# Patient Record
Sex: Female | Born: 1980 | Race: Black or African American | Hispanic: No | Marital: Single | Smoking: Current every day smoker
Health system: Southern US, Community
[De-identification: ages and names within clinical notes are randomized; demographics above are authoritative.]

## PROBLEM LIST (undated history)

## (undated) DIAGNOSIS — J45909 Unspecified asthma, uncomplicated: Secondary | ICD-10-CM

## (undated) DIAGNOSIS — Z720 Tobacco use: Secondary | ICD-10-CM

## (undated) DIAGNOSIS — Z8759 Personal history of other complications of pregnancy, childbirth and the puerperium: Secondary | ICD-10-CM

## (undated) DIAGNOSIS — I10 Essential (primary) hypertension: Secondary | ICD-10-CM

## (undated) DIAGNOSIS — F1290 Cannabis use, unspecified, uncomplicated: Secondary | ICD-10-CM

---

## 2021-11-30 ENCOUNTER — Observation Stay (HOSPITAL_COMMUNITY): Payer: Medicaid - Out of State

## 2021-11-30 ENCOUNTER — Encounter (HOSPITAL_COMMUNITY): Payer: Self-pay | Admitting: Internal Medicine

## 2021-11-30 ENCOUNTER — Emergency Department (HOSPITAL_COMMUNITY): Payer: Medicaid - Out of State

## 2021-11-30 ENCOUNTER — Observation Stay (HOSPITAL_COMMUNITY)
Admission: EM | Admit: 2021-11-30 | Discharge: 2021-12-01 | Disposition: A | Discharge status: Home / Self Care | Payer: Medicaid - Out of State | Attending: Internal Medicine | Admitting: Internal Medicine

## 2021-11-30 DIAGNOSIS — R569 Unspecified convulsions: Principal | ICD-10-CM

## 2021-11-30 DIAGNOSIS — Z20822 Contact with and (suspected) exposure to covid-19: Secondary | ICD-10-CM | POA: Diagnosis not present

## 2021-11-30 DIAGNOSIS — I10 Essential (primary) hypertension: Secondary | ICD-10-CM | POA: Diagnosis not present

## 2021-11-30 DIAGNOSIS — N3941 Urge incontinence: Secondary | ICD-10-CM | POA: Diagnosis not present

## 2021-11-30 DIAGNOSIS — G4733 Obstructive sleep apnea (adult) (pediatric): Secondary | ICD-10-CM

## 2021-11-30 DIAGNOSIS — R4182 Altered mental status, unspecified: Secondary | ICD-10-CM

## 2021-11-30 DIAGNOSIS — M25559 Pain in unspecified hip: Secondary | ICD-10-CM

## 2021-11-30 DIAGNOSIS — R631 Polydipsia: Secondary | ICD-10-CM | POA: Insufficient documentation

## 2021-11-30 HISTORY — DX: Personal history of other complications of pregnancy, childbirth and the puerperium: Z87.59

## 2021-11-30 HISTORY — DX: Unspecified asthma, uncomplicated: J45.909

## 2021-11-30 HISTORY — DX: Essential (primary) hypertension: I10

## 2021-11-30 HISTORY — DX: Cannabis use, unspecified, uncomplicated: F12.90

## 2021-11-30 HISTORY — DX: Tobacco use: Z72.0

## 2021-11-30 LAB — TROPONIN I (HIGH SENSITIVITY)
Troponin I (High Sensitivity): 12 ng/L (ref <18)
Troponin I (High Sensitivity): 14 ng/L (ref <18)

## 2021-11-30 LAB — COMPREHENSIVE METABOLIC PANEL
ALT: 16 U/L (ref 0–44)
AST: 24 U/L (ref 15–41)
Albumin: 3.6 g/dL (ref 3.5–5.0)
Alkaline Phosphatase: 99 U/L (ref 38–126)
Anion gap: 10 (ref 5–15)
BUN: 12 mg/dL (ref 6–20)
CO2: 20 mmol/L — ABNORMAL LOW (ref 22–32)
Calcium: 8.8 mg/dL — ABNORMAL LOW (ref 8.9–10.3)
Chloride: 106 mmol/L (ref 98–111)
Creatinine, Ser: 0.73 mg/dL (ref 0.44–1.00)
GFR, Estimated: >60 mL/min (ref >60)
Glucose, Bld: 117 mg/dL — ABNORMAL HIGH (ref 70–99)
Potassium: 4.3 mmol/L (ref 3.5–5.1)
Sodium: 136 mmol/L (ref 135–145)
Total Bilirubin: 0.3 mg/dL (ref 0.3–1.2)
Total Protein: 8.1 g/dL (ref 6.5–8.1)

## 2021-11-30 LAB — URINALYSIS, ROUTINE W REFLEX MICROSCOPIC
Bilirubin Urine: NEGATIVE
Glucose, UA: NEGATIVE mg/dL
Ketones, ur: NEGATIVE mg/dL
Leukocytes,Ua: NEGATIVE
Nitrite: NEGATIVE
Protein, ur: 30 mg/dL — AB
Specific Gravity, Urine: 1.01 (ref 1.005–1.030)
pH: 5 (ref 5.0–8.0)

## 2021-11-30 LAB — RESP PANEL BY RT-PCR (FLU A&B, COVID) ARPGX2
Influenza A by PCR: NEGATIVE
Influenza B by PCR: NEGATIVE
SARS Coronavirus 2 by RT PCR: NEGATIVE

## 2021-11-30 LAB — CBC
HCT: 44.2 % (ref 36.0–46.0)
Hemoglobin: 14.1 g/dL (ref 12.0–15.0)
MCH: 27.4 pg (ref 26.0–34.0)
MCHC: 31.9 g/dL (ref 30.0–36.0)
MCV: 86 fL (ref 80.0–100.0)
Platelets: 292 10*3/uL (ref 150–400)
RBC: 5.14 MIL/uL — ABNORMAL HIGH (ref 3.87–5.11)
RDW: 16.2 % — ABNORMAL HIGH (ref 11.5–15.5)
WBC: 10.1 10*3/uL (ref 4.0–10.5)
nRBC: 0 % (ref 0.0–0.2)

## 2021-11-30 LAB — RAPID URINE DRUG SCREEN, HOSP PERFORMED
Amphetamines: NOT DETECTED
Barbiturates: NOT DETECTED
Benzodiazepines: NOT DETECTED
Cocaine: NOT DETECTED
Opiates: NOT DETECTED
Tetrahydrocannabinol: POSITIVE — AB

## 2021-11-30 LAB — I-STAT BETA HCG BLOOD, ED (MC, WL, AP ONLY): I-stat hCG, quantitative: <5 m[IU]/mL (ref <5)

## 2021-11-30 LAB — PREGNANCY, URINE: Preg Test, Ur: NEGATIVE

## 2021-11-30 LAB — CBG MONITORING, ED: Glucose-Capillary: 119 mg/dL — ABNORMAL HIGH (ref 70–99)

## 2021-11-30 LAB — ETHANOL: Alcohol, Ethyl (B): <10 mg/dL (ref <10)

## 2021-11-30 IMAGING — CR DG CHEST 2V
2 series · 2 of 2 positions shown · non-contrast
Comparison: [DATE] at [DATE] p.m.

CLINICAL DATA: Found unresponsive on bathroom floor, confusion

EXAM:
CHEST - 2 VIEW

[chest lat]
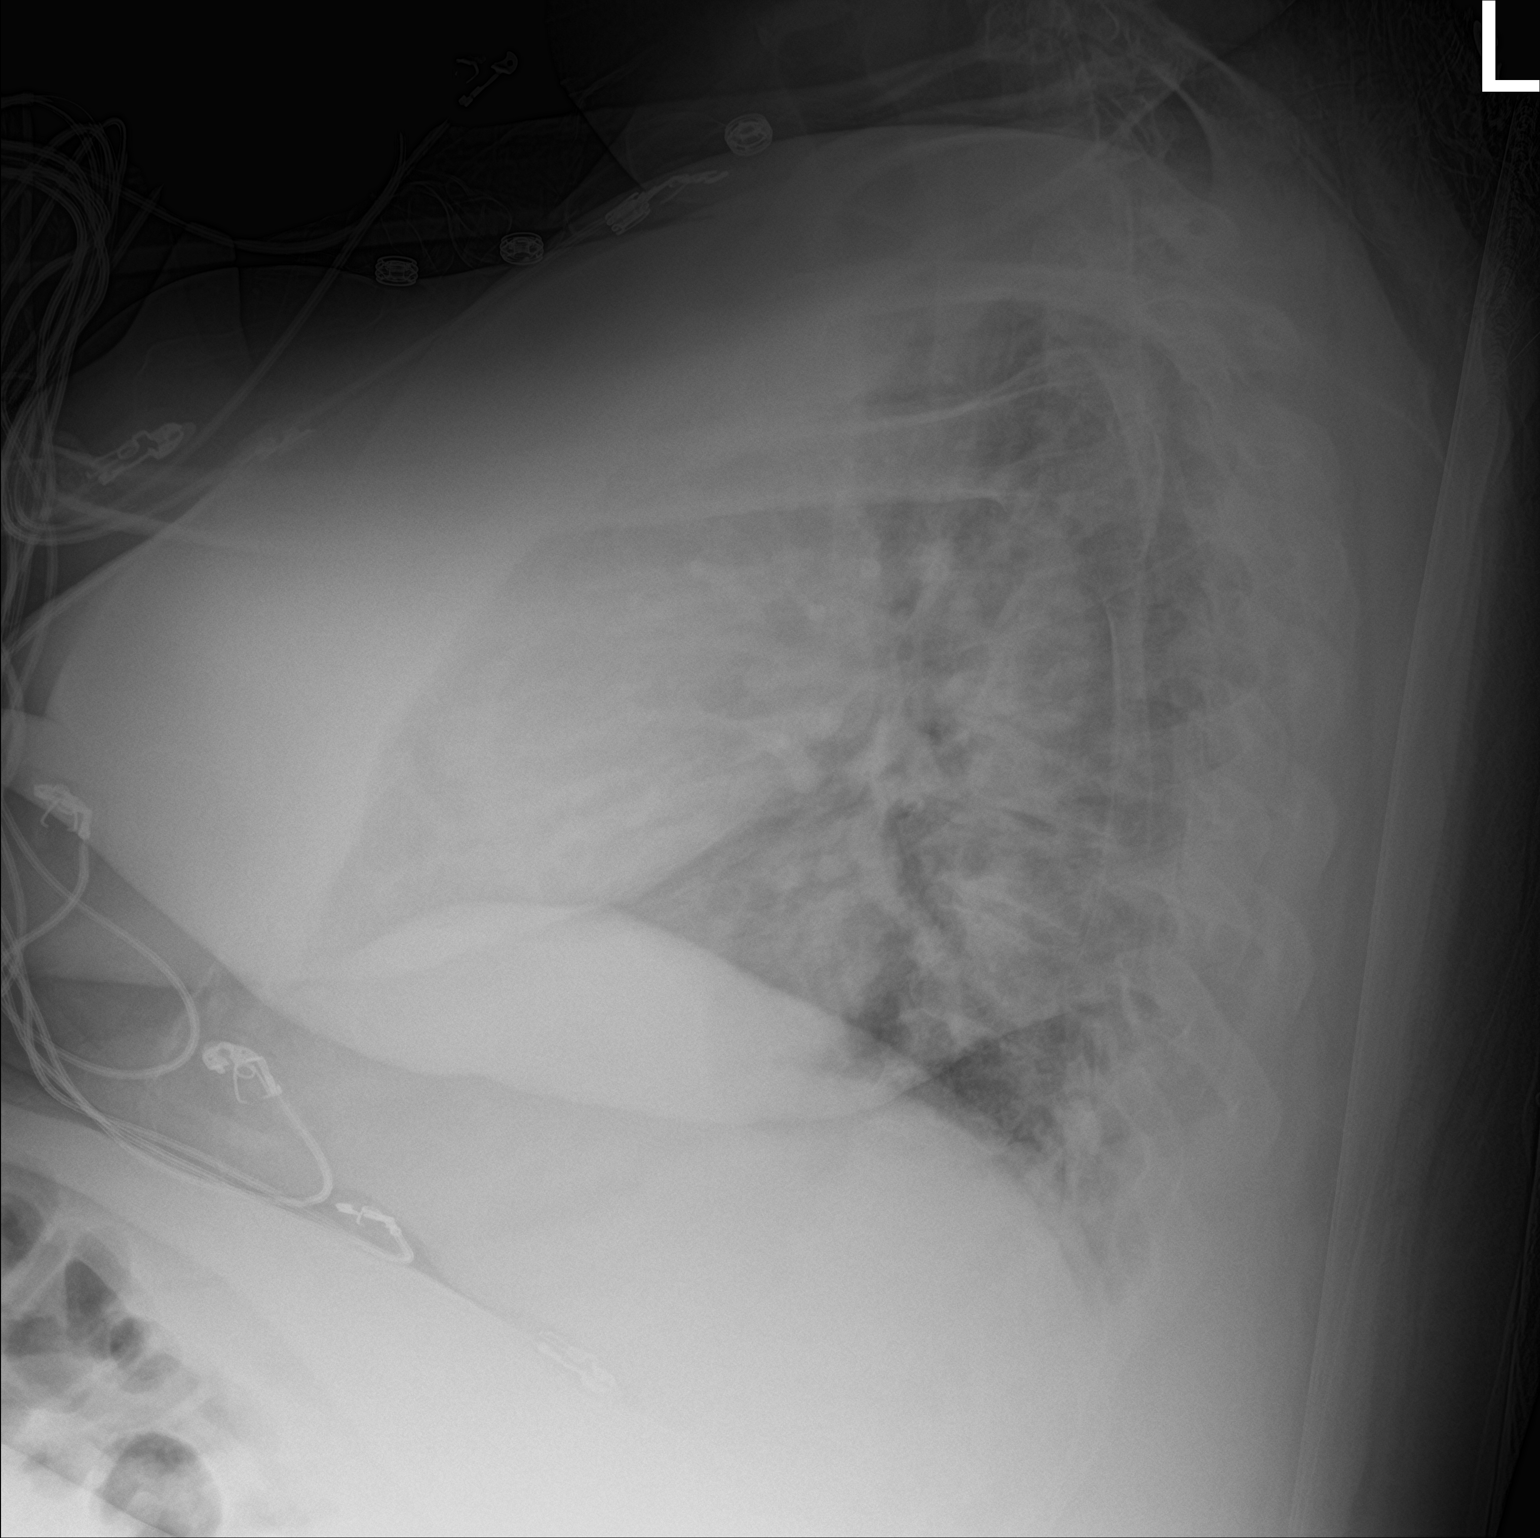

[chest ap]
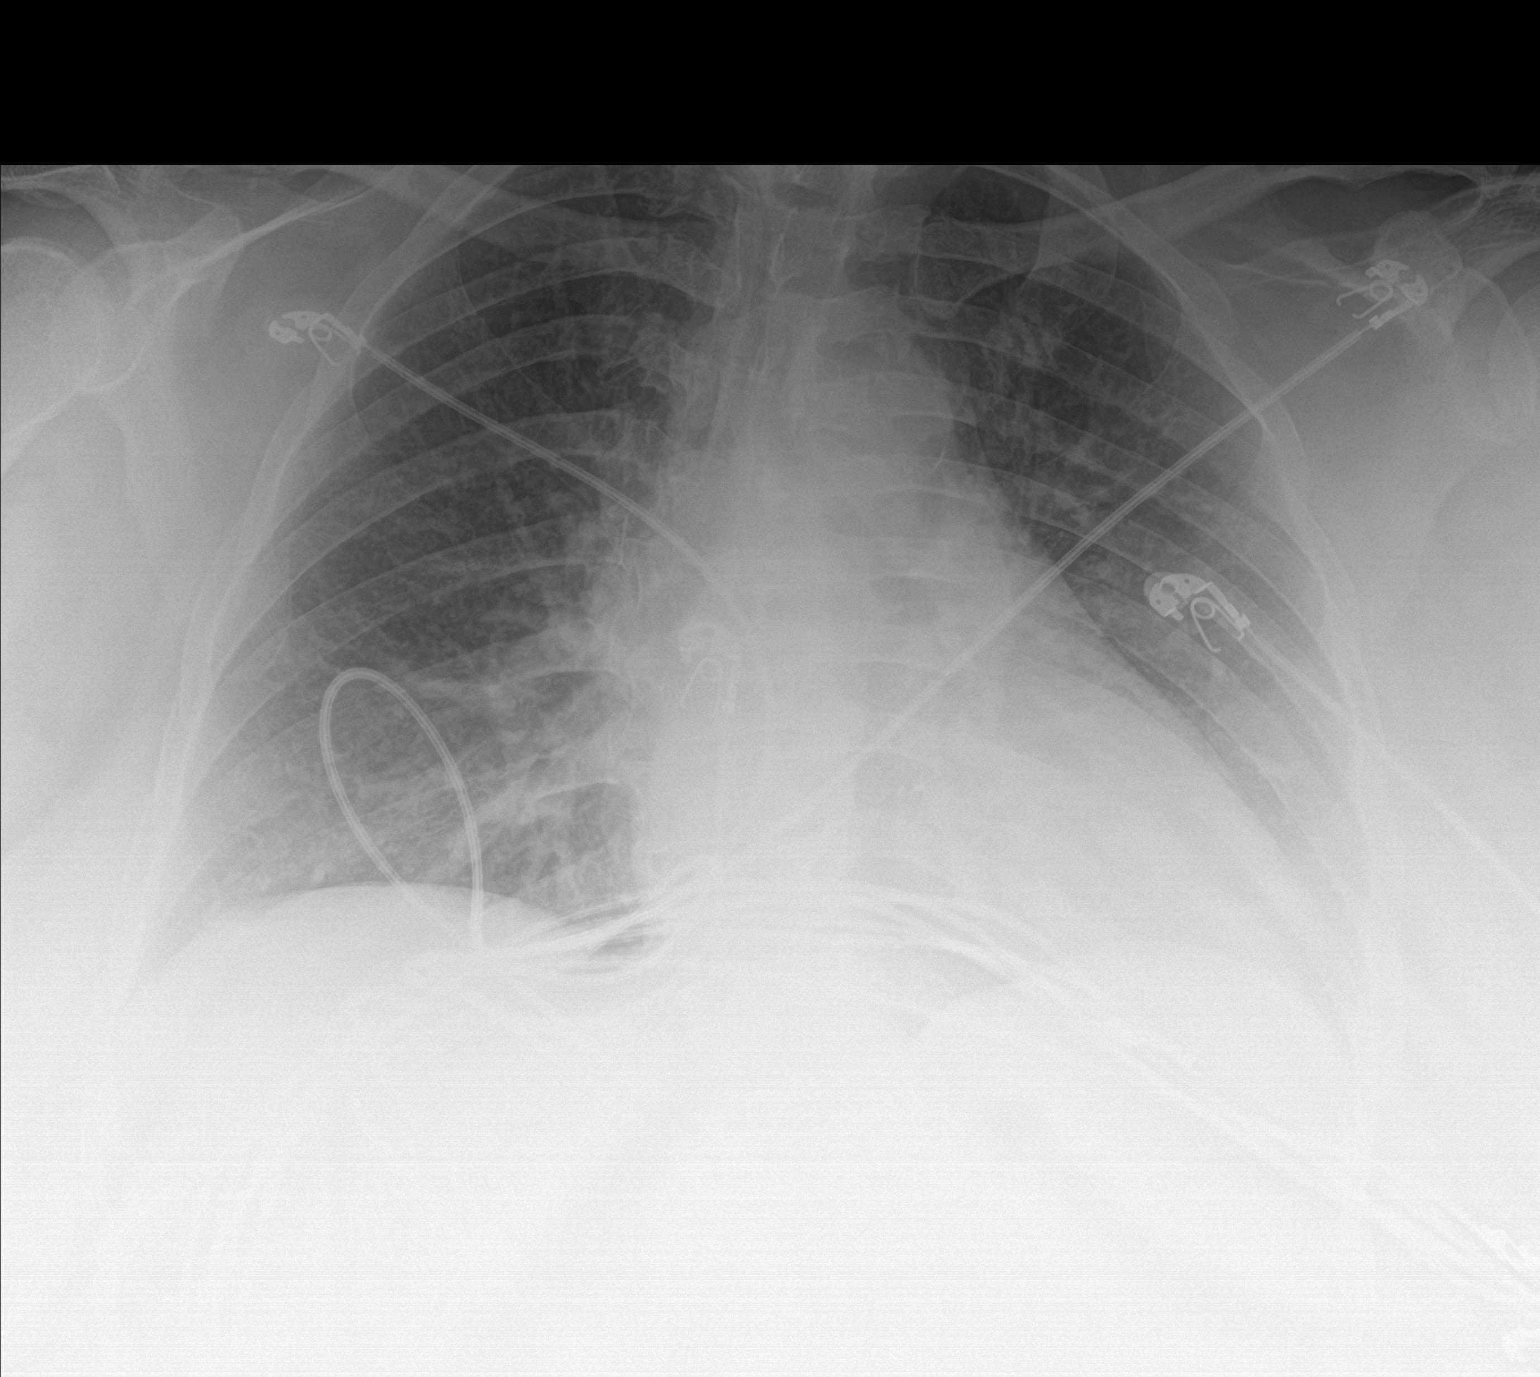

[2 of 2 positions shown; findings below may reference images not displayed]

FINDINGS: Frontal and lateral views of the chest demonstrate a stable enlarged
cardiac silhouette. No acute airspace disease, effusion, or
pneumothorax. No acute bony abnormality.
IMPRESSION: 1. Stable chest, no acute process.

## 2021-11-30 IMAGING — CR DG HIP (WITH OR WITHOUT PELVIS) 2-3V*L*
3 series · 3 of 3 positions shown · non-contrast
Comparison: None.

CLINICAL DATA: Acute left hip pain

EXAM:
DG HIP (WITH OR WITHOUT PELVIS) 2-3V LEFT

[pelvis ap]
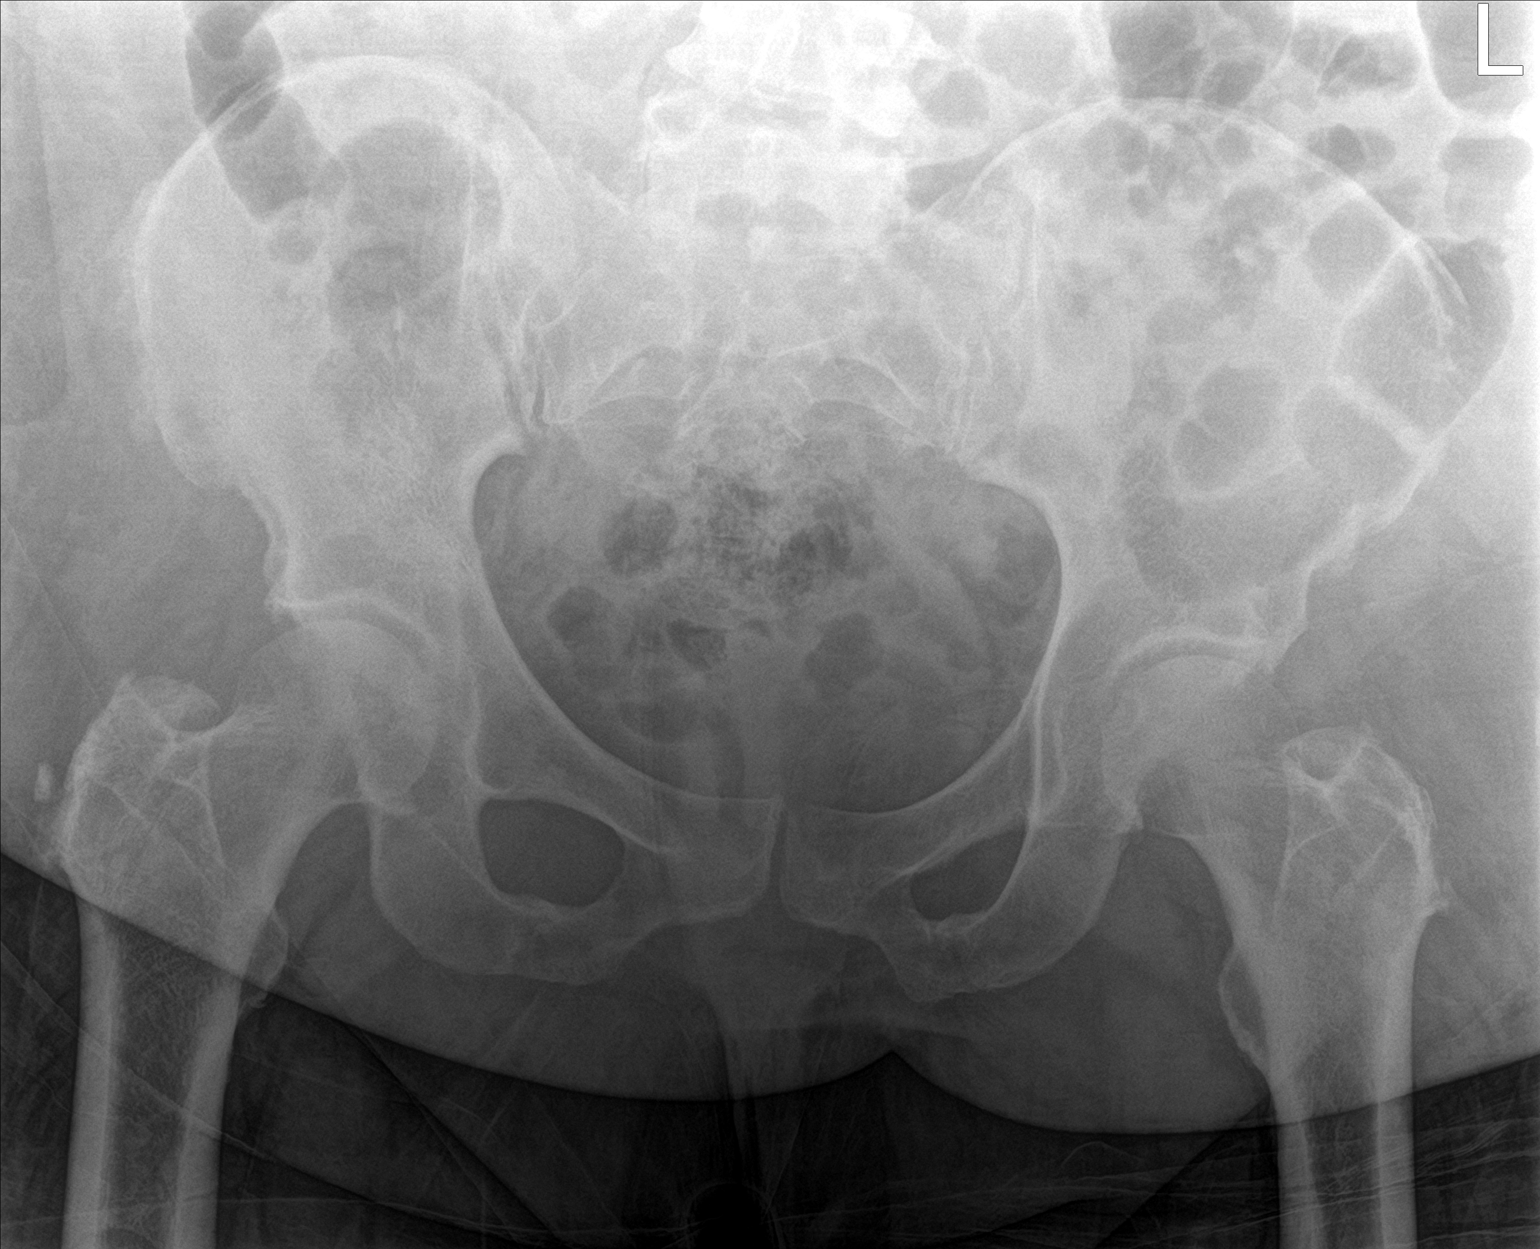

[hip ap]
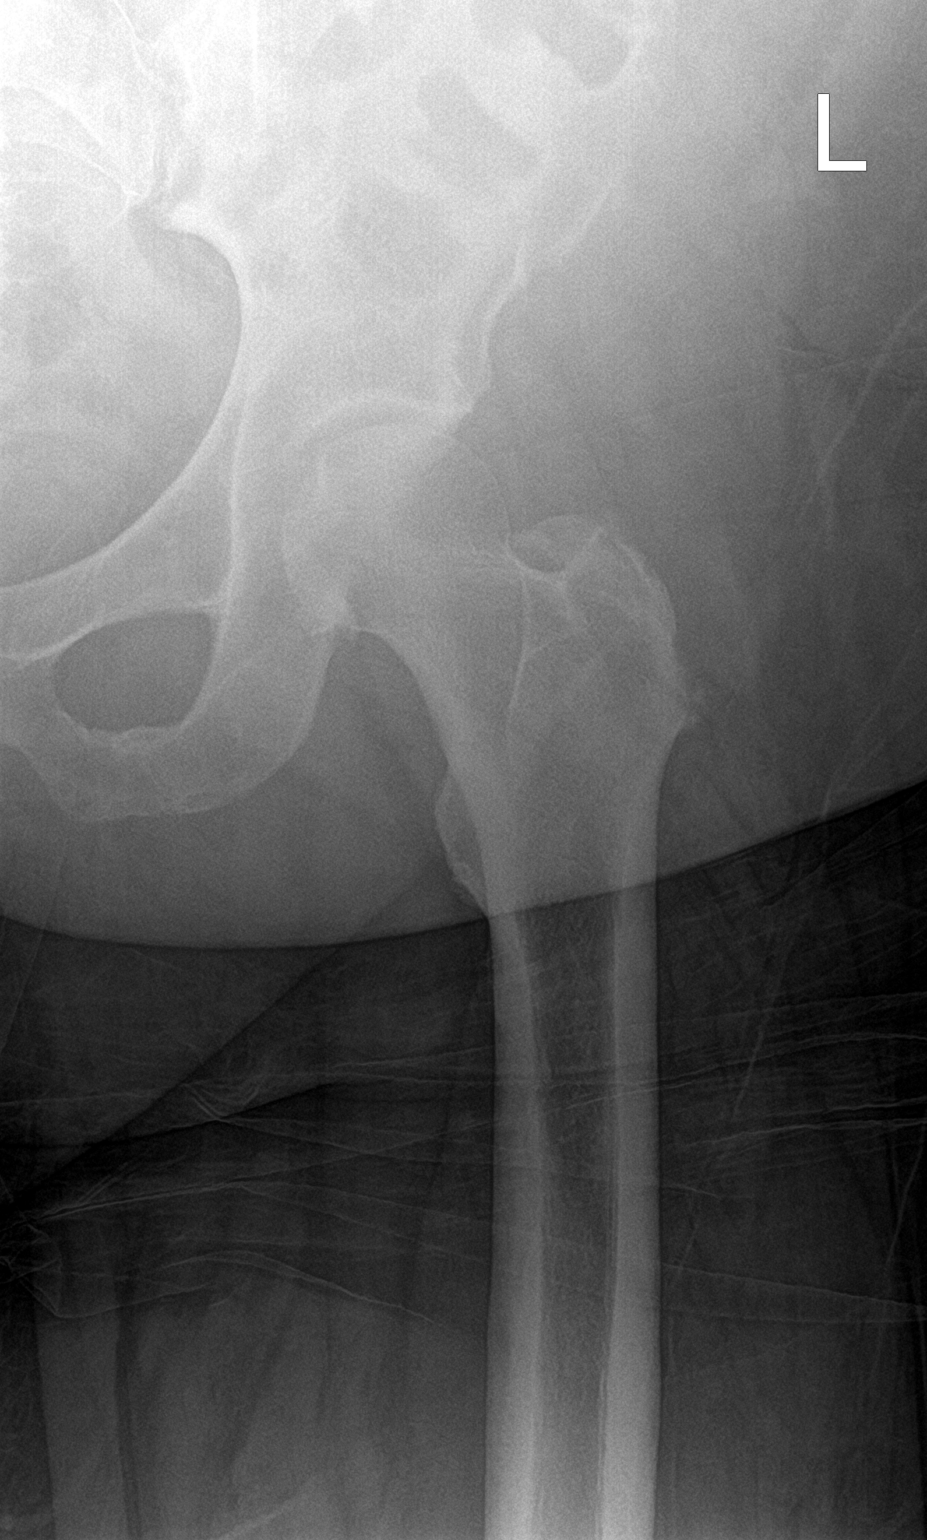

[hip lat]
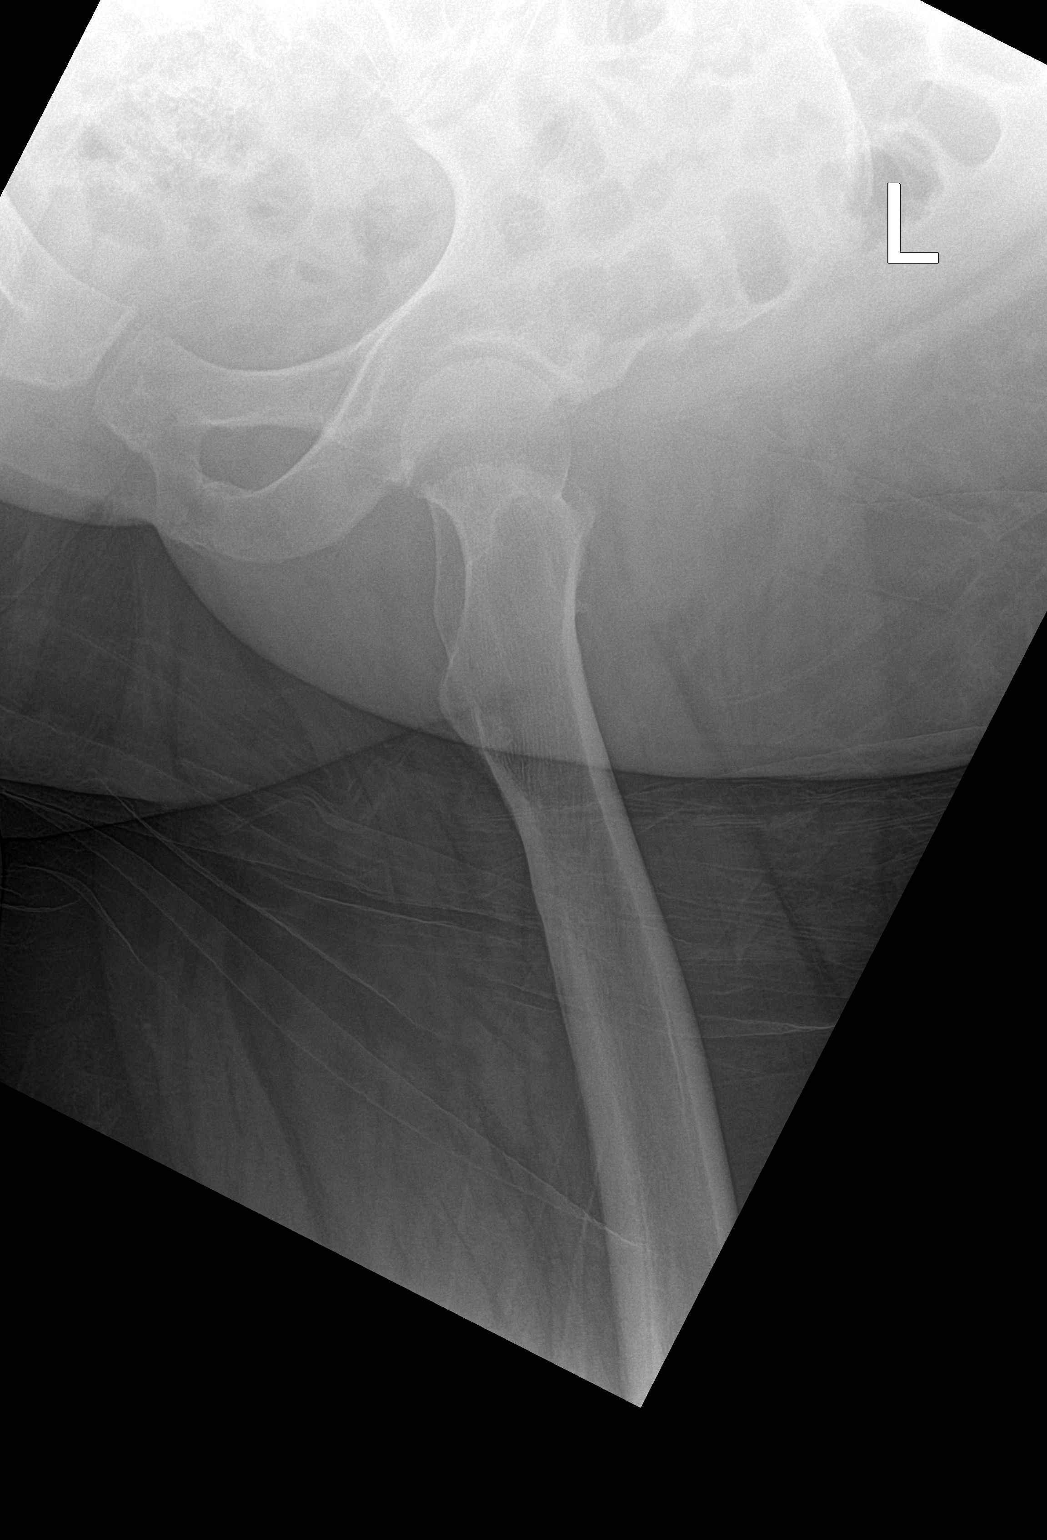

[3 of 3 positions shown; findings below may reference images not displayed]

FINDINGS: Frontal view of the pelvis as well as frontal and frogleg lateral
views of the left hip are obtained. No acute fracture, subluxation,
or dislocation. Joint spaces are well preserved. Soft tissues are
unremarkable.
IMPRESSION: 1. No acute bony abnormality.

## 2021-11-30 IMAGING — DX DG CHEST 1V PORT
1 series · 1 of 1 positions shown · non-contrast
Comparison: None.

CLINICAL DATA: Headache, found unresponsive on bathroom floor

EXAM:
PORTABLE CHEST 1 VIEW

[chest ap]
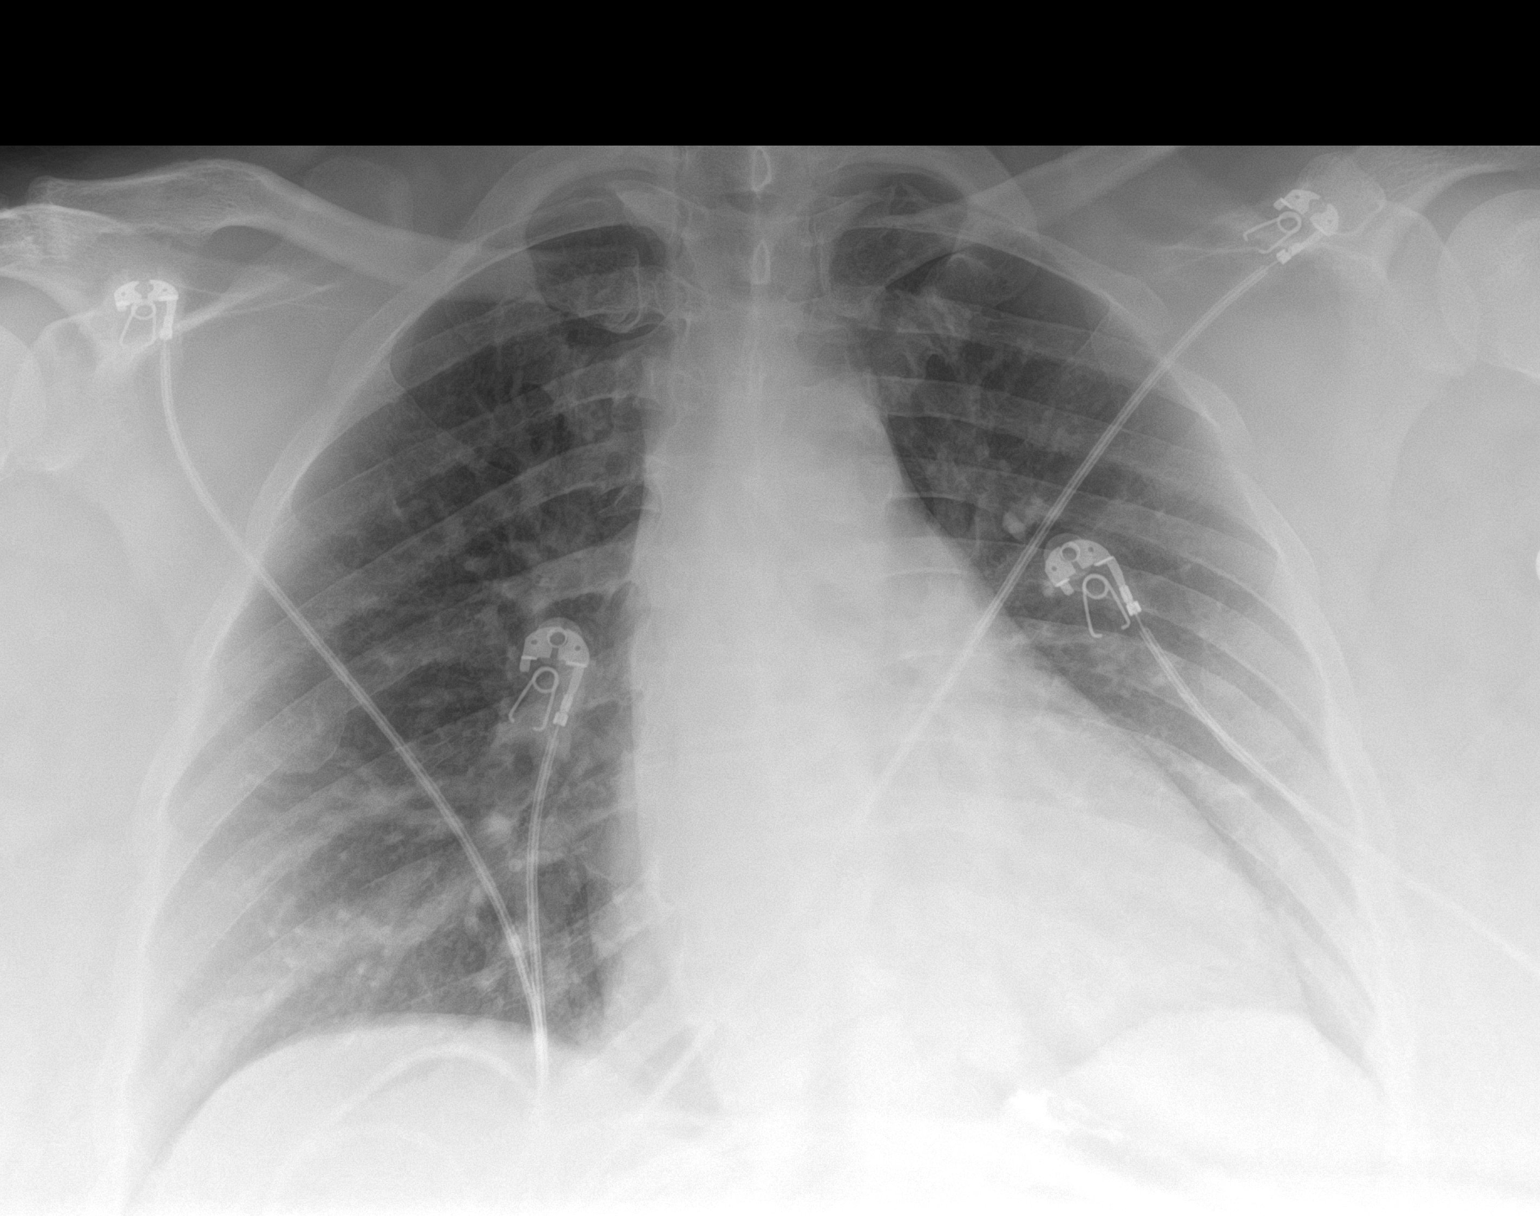

[1 of 1 positions shown; findings below may reference images not displayed]

FINDINGS: Top-normal heart size. Normal mediastinal contour. No pneumothorax.
No pleural effusion. Lungs appear clear, with no acute consolidative
airspace disease and no pulmonary edema.
IMPRESSION: No active disease.

## 2021-11-30 IMAGING — MR MR MRV HEAD W/O CM
1 series · 48 of 48 positions shown · non-contrast
Comparison: No pertinent prior exam.

CLINICAL DATA: Seizure

EXAM:
MRI HEAD WITHOUT CONTRAST
MRV HEAD WITHOUT CONTRAST
TECHNIQUE: Multiplanar, multi-echo pulse sequences of the brain and surrounding
structures were acquired without intravenous contrast. Angiographic
images of the intracranial venous structures were acquired using MRV
technique without intravenous contrast.

[Series 7: tof_fl2d_paracor · coronal · 2.0mm · 0.98mm/px · 48 of 150 slices shown]
[im 1/150]
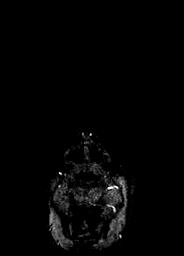
[im 4/150]
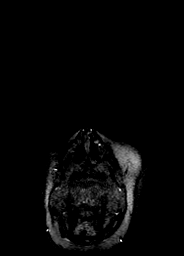
[im 7/150]
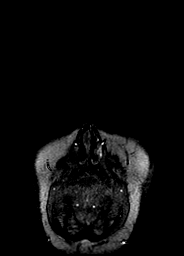
[im 10/150]
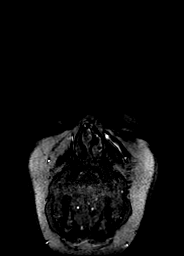
[im 13/150]
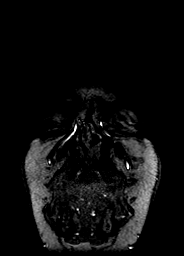
[im 16/150]
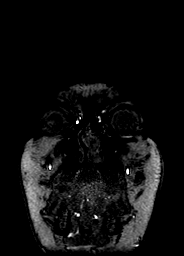
[im 20/150]
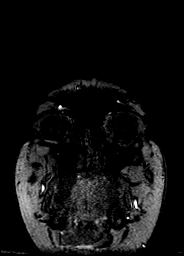
[im 23/150]
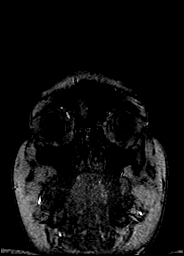
[im 26/150]
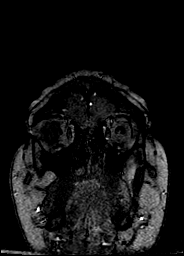
[im 29/150]
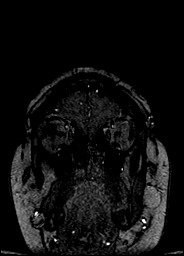
[im 32/150]
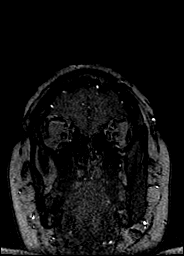
[im 35/150]
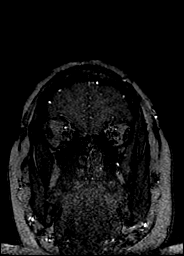
[im 39/150]
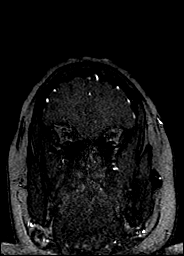
[im 42/150]
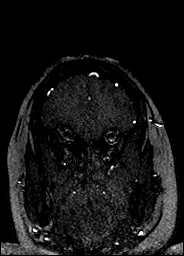
[im 45/150]
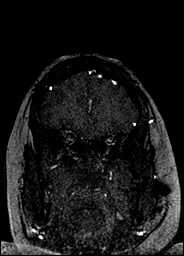
[im 48/150]
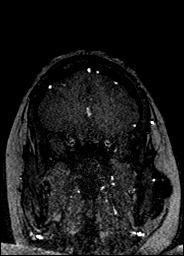
[im 51/150]
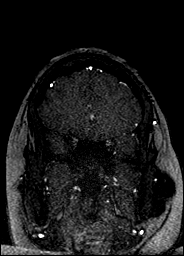
[im 54/150]
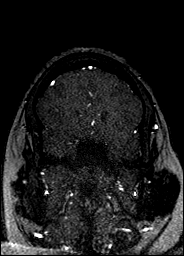
[im 58/150]
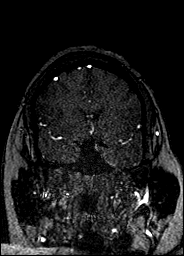
[im 61/150]
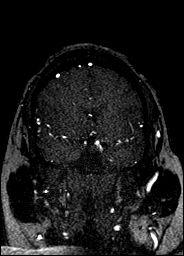
[im 64/150]
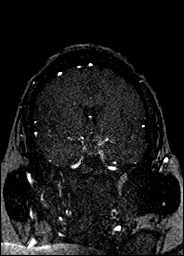
[im 67/150]
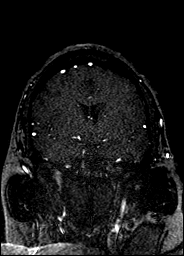
[im 70/150]
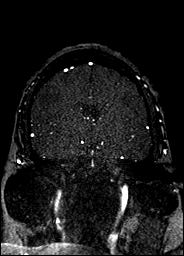
[im 73/150]
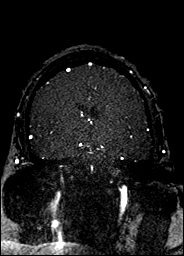
[im 77/150]
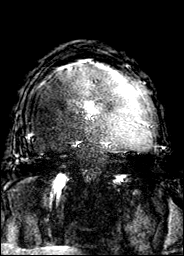
[im 80/150]
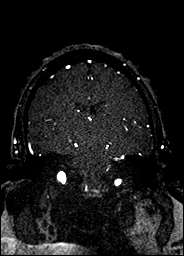
[im 83/150]
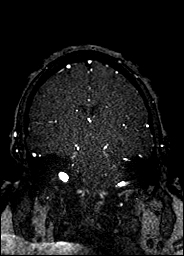
[im 86/150]
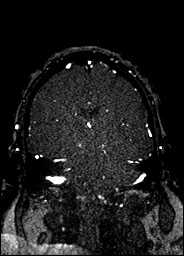
[im 89/150]
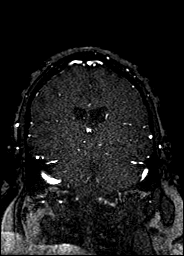
[im 92/150]
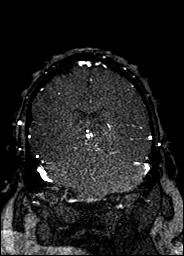
[im 96/150]
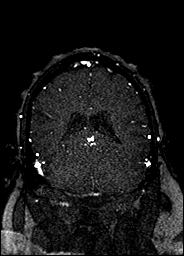
[im 99/150]
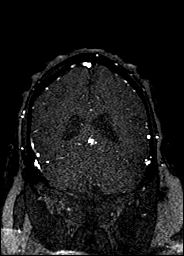
[im 102/150]
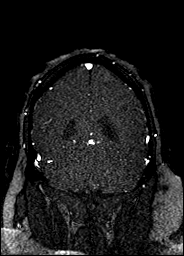
[im 105/150]
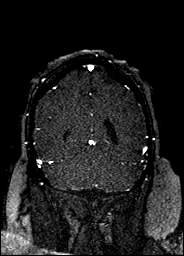
[im 108/150]
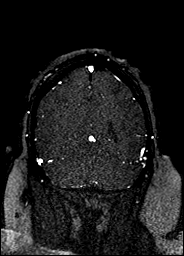
[im 111/150]
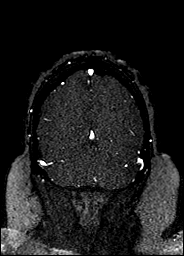
[im 115/150]
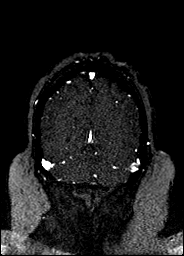
[im 118/150]
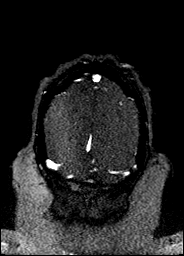
[im 121/150]
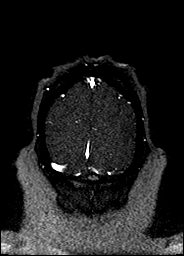
[im 124/150]
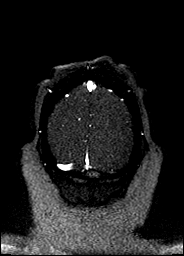
[im 127/150]
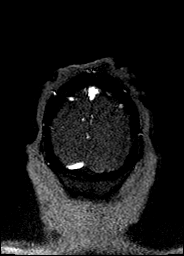
[im 130/150]
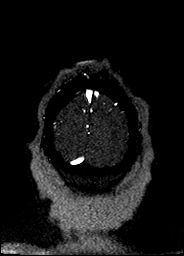
[im 134/150]
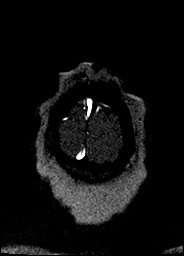
[im 137/150]
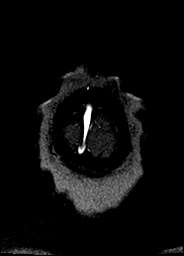
[im 140/150]
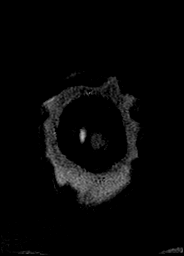
[im 143/150]
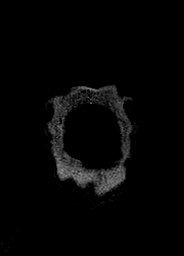
[im 146/150]
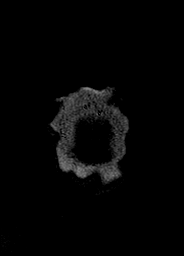
[im 150/150]
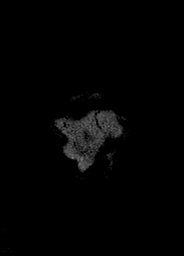

[48 of 48 positions shown; findings below may reference images not displayed]

FINDINGS: MRI HEAD WITHOUT CONTRAST

Brain: No acute infarct, mass effect or extra-axial collection. No
acute or chronic hemorrhage. Normal white matter signal, parenchymal
volume and CSF spaces. The midline structures are normal.

Vascular: Major flow voids are preserved.

Skull and upper cervical spine: Normal calvarium and skull base.
Visualized upper cervical spine and soft tissues are normal.

Sinuses/Orbits:No paranasal sinus fluid levels or advanced mucosal
thickening. No mastoid or middle ear effusion. Normal orbits.

MR VENOGRAM WITHOUT CONTRAST

Superior sagittal sinus: Normal.

Straight sinus: Normal.

Inferior sagittal sinus, vein of SALIOUS and internal cerebral veins:
Normal.

Transverse sinuses: Limited flow related enhancement of the left
transverse sinus, likely a normal variant. Right transverse sinus is
normal.

Sigmoid sinuses: Normal.

Visualized jugular veins: Normal.
IMPRESSION: Normal MRI and MRV of the brain.

## 2021-11-30 IMAGING — MR MR HEAD W/O CM
17 of 19 series · 41 of 48 positions shown · non-contrast
Comparison: No pertinent prior exam.

CLINICAL DATA: Seizure

EXAM:
MRI HEAD WITHOUT CONTRAST
MRV HEAD WITHOUT CONTRAST
TECHNIQUE: Multiplanar, multi-echo pulse sequences of the brain and surrounding
structures were acquired without intravenous contrast. Angiographic
images of the intracranial venous structures were acquired using MRV
technique without intravenous contrast.

[Series 5: DWI · axial · 3.0mm · 0.92mm/px · z∈[-100,+68]mm · 6 of 114 slices shown (1 of 4)]
[im 1/114]
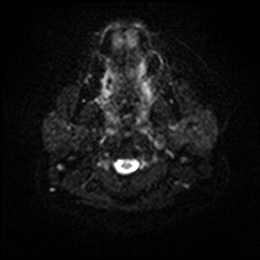
[im 23/114]
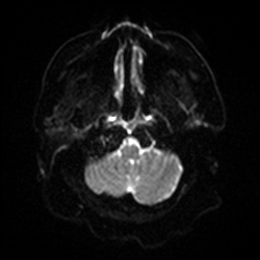
[im 46/114]
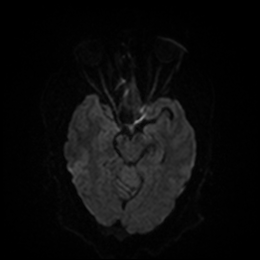
[im 68/114]
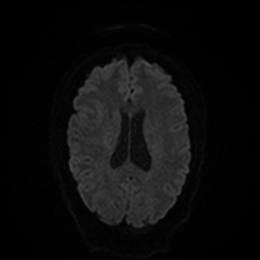
[im 91/114]
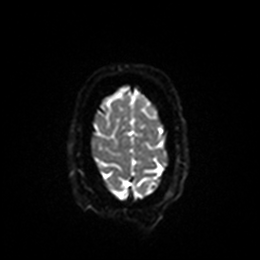
[im 114/114]
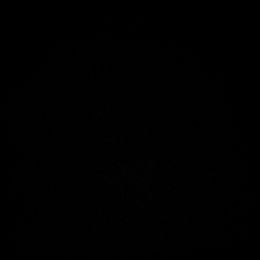

[Series 6: DWI · axial · 3.0mm · 0.92mm/px · z∈[-100,+62]mm · 3 of 55 slices shown (2 of 4)]
[im 1/55]
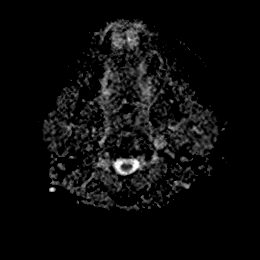
[im 28/55]
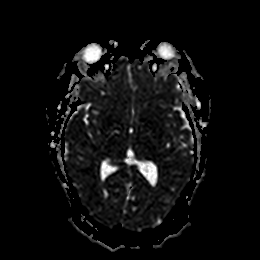
[im 55/55]
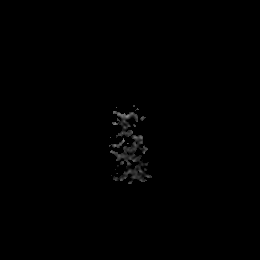

[Series 7: DWI · coronal · 4.0mm · 0.88mm/px · 3 of 82 slices shown (3 of 4)]
[im 1/82]
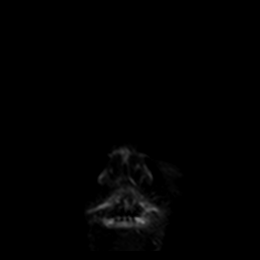
[im 41/82]
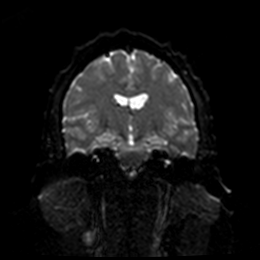
[im 82/82]
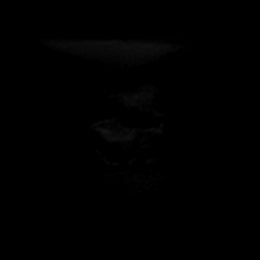

[Series 8: DWI · coronal · 4.0mm · 0.88mm/px · 2 of 41 slices shown (4 of 4)]
[im 1/41]
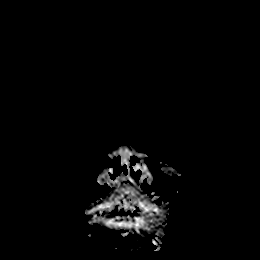
[im 41/41]
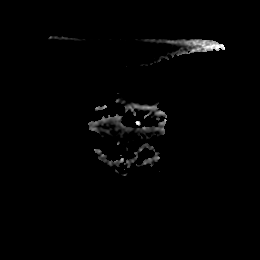

[Series 9: T1 · sagittal · 5.0mm · 0.78mm/px · 1 of 29 slices shown]
[im 1/29]
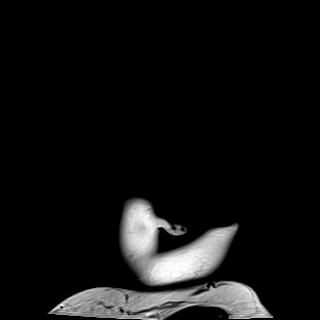

[Series 10: T2 · axial · 5.0mm · 0.75mm/px · 1 of 30 slices shown (1 of 4)]
[im 1/30]
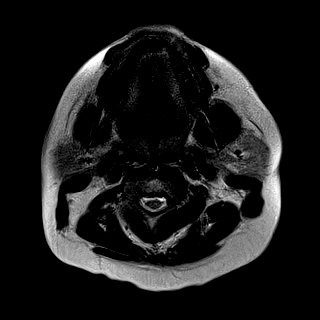

[Series 12: FLAIR · axial · 5.0mm · 0.94mm/px · 1 of 29 slices shown (1 of 2)]
[im 1/29]
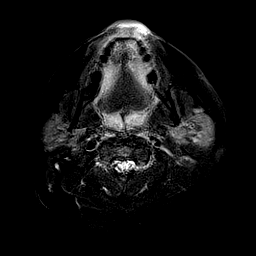

[Series 13: mag_images · axial · 3.0mm · 0.94mm/px · z∈[-105,+72]mm · 2 of 60 slices shown]
[im 1/60]
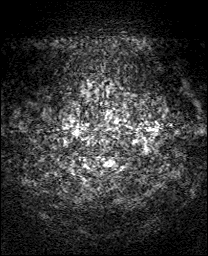
[im 60/60]
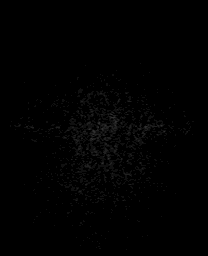

[Series 14: pha_images · axial · 3.0mm · 0.94mm/px · z∈[-102,+72]mm · 2 of 50 slices shown]
[im 1/50]
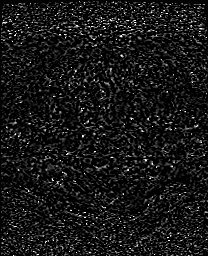
[im 50/50]
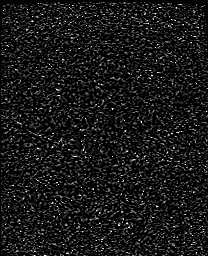

[Series 15: swi_images · axial · 3.0mm · 0.94mm/px · z∈[-105,+72]mm · 2 of 60 slices shown]
[im 1/60]
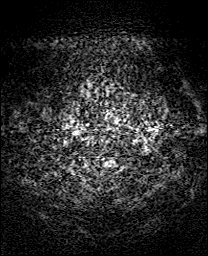
[im 60/60]
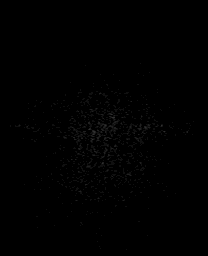

[Series 16: mip_images(sw) · axial · 24.0mm · 0.94mm/px · z∈[-94,+62]mm · 2 of 53 slices shown]
[im 1/53]
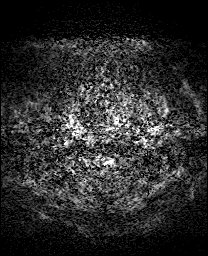
[im 53/53]
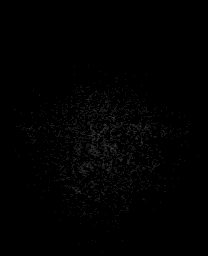

[Series 18: t1_mprage_tra_p2_iso · axial · 1.0mm · 0.98mm/px · z∈[-104,+71]mm · 7 of 176 slices shown]
[im 1/176]
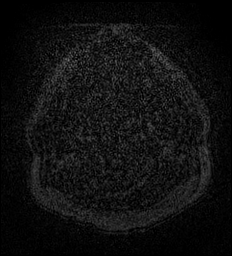
[im 30/176]
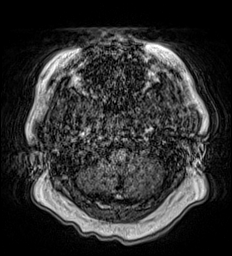
[im 59/176]
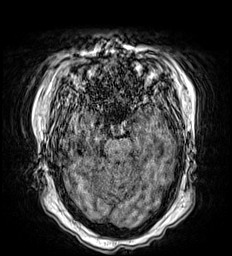
[im 88/176]
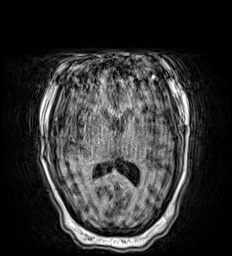
[im 117/176]
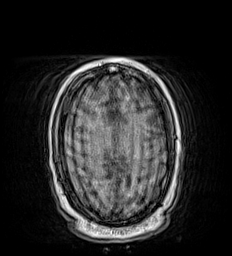
[im 146/176]
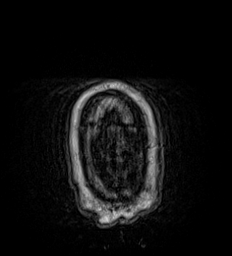
[im 176/176]
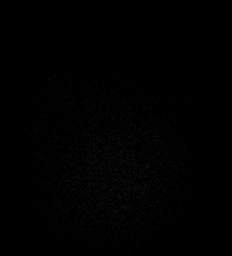

[Series 19: t1_mprage_tra_p2_iso_mpr_coronal · coronal · 1.0mm · 0.45mm/px · 2 of 124 slices shown]
[im 1/124]
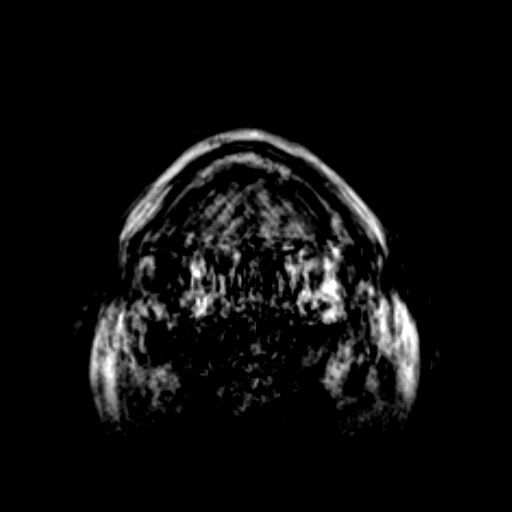
[im 31/124]
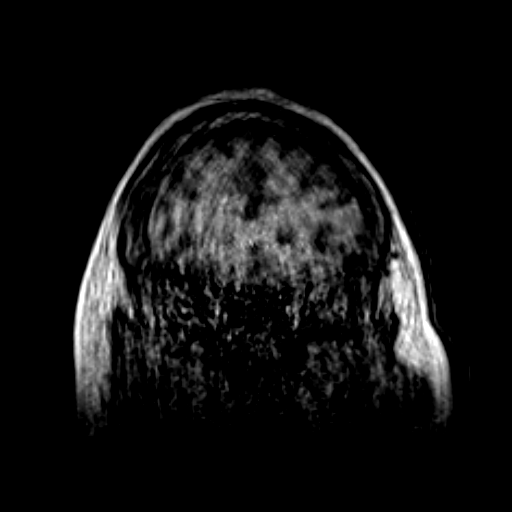

[Series 21: T2 · coronal · 3.0mm · 0.30mm/px · 2 of 43 slices shown (2 of 4)]
[im 1/43]
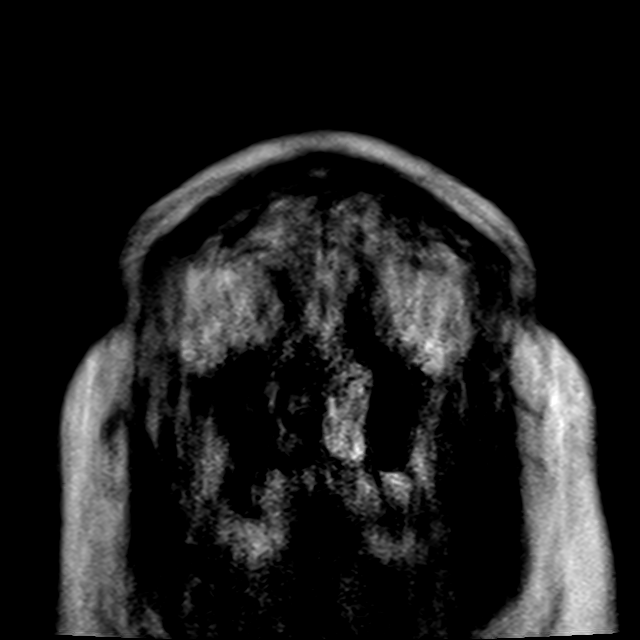
[im 43/43]
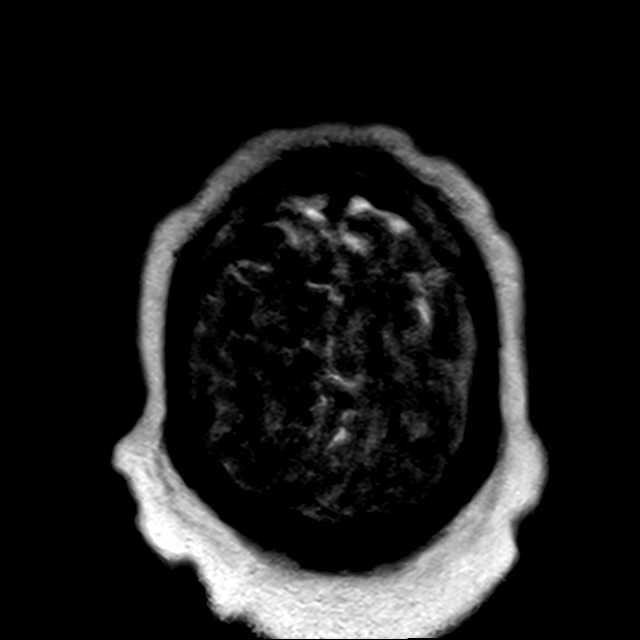

[Series 22: FLAIR · coronal · 3.0mm · 0.59mm/px · 2 of 41 slices shown (2 of 2)]
[im 1/41]
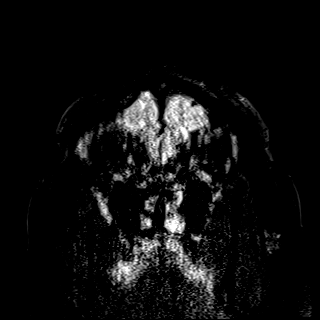
[im 41/41]
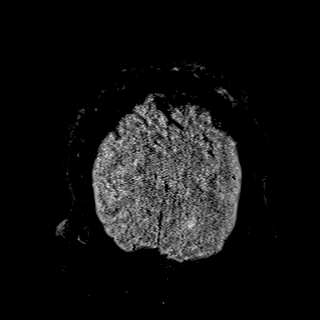

[Series 23: T2 · coronal · 3.0mm · 0.30mm/px · 2 of 43 slices shown (3 of 4)]
[im 1/43]
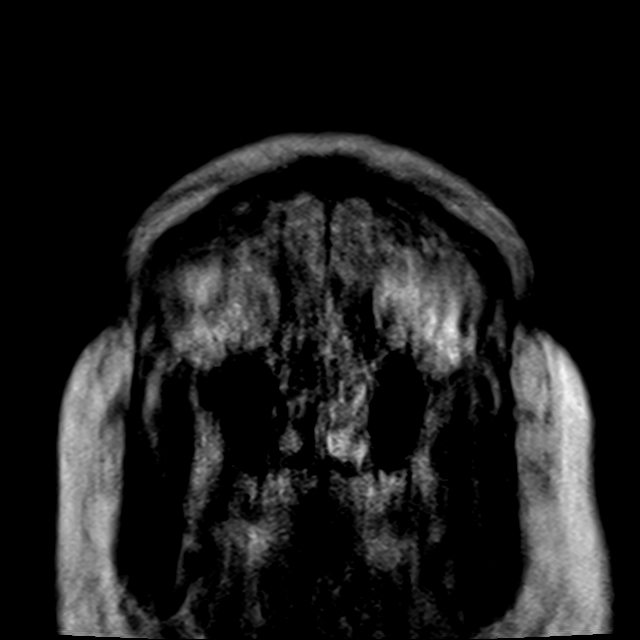
[im 43/43]
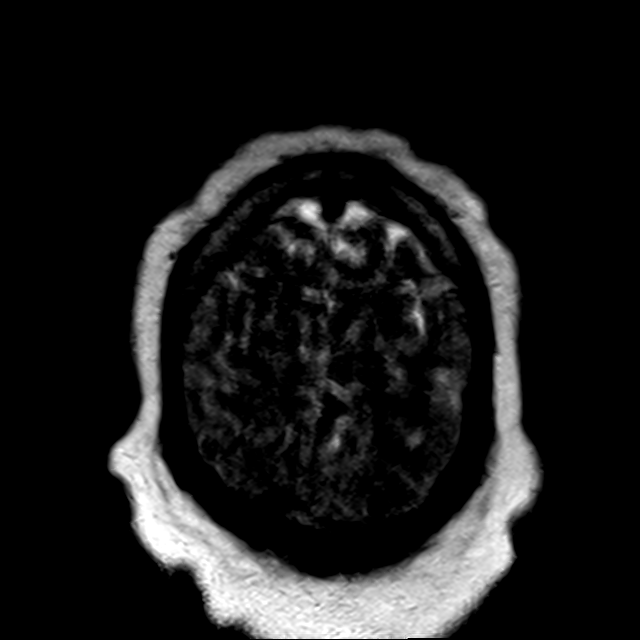

[Series 24: T2 · coronal · 5.0mm · 0.72mm/px · 1 of 35 slices shown (4 of 4)]
[im 1/35]
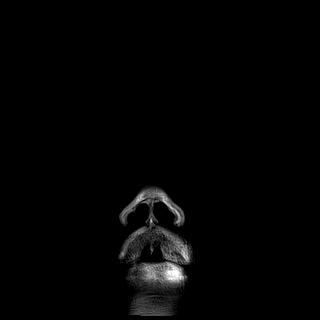

[41 of 48 positions shown; findings below may reference images not displayed]

FINDINGS: MRI HEAD WITHOUT CONTRAST

Brain: No acute infarct, mass effect or extra-axial collection. No
acute or chronic hemorrhage. Normal white matter signal, parenchymal
volume and CSF spaces. The midline structures are normal.

Vascular: Major flow voids are preserved.

Skull and upper cervical spine: Normal calvarium and skull base.
Visualized upper cervical spine and soft tissues are normal.

Sinuses/Orbits:No paranasal sinus fluid levels or advanced mucosal
thickening. No mastoid or middle ear effusion. Normal orbits.

MR VENOGRAM WITHOUT CONTRAST

Superior sagittal sinus: Normal.

Straight sinus: Normal.

Inferior sagittal sinus, vein of SALIOUS and internal cerebral veins:
Normal.

Transverse sinuses: Limited flow related enhancement of the left
transverse sinus, likely a normal variant. Right transverse sinus is
normal.

Sigmoid sinuses: Normal.

Visualized jugular veins: Normal.
IMPRESSION: Normal MRI and MRV of the brain.

## 2021-11-30 IMAGING — CT CT HEAD W/O CM
3 series · 17 of 37 positions shown, 19 images · non-contrast
Comparison: None.

CLINICAL DATA: Syncope.  Altered mental status.  Seizures.



[Series 3: head bone · axial · 0.45mm/px · z∈[-112,+10]mm · 7 of 88 slices shown]
[im 9/88  bone]
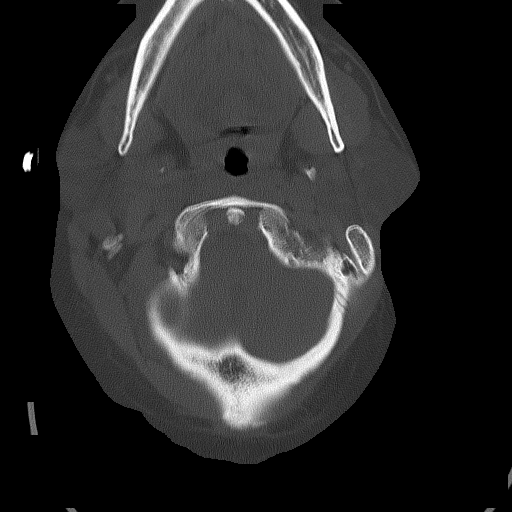
[im 18/88  bone]
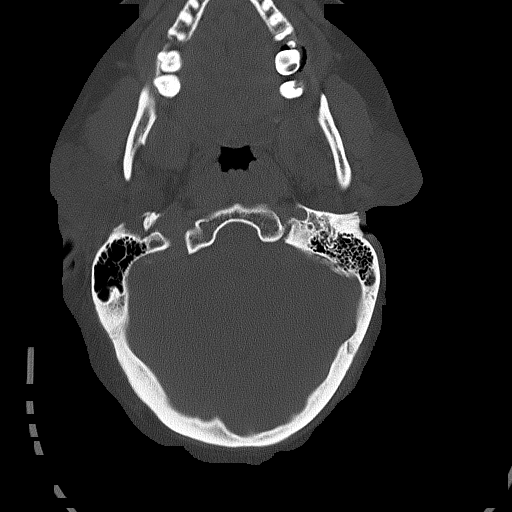
[im 27/88  bone]
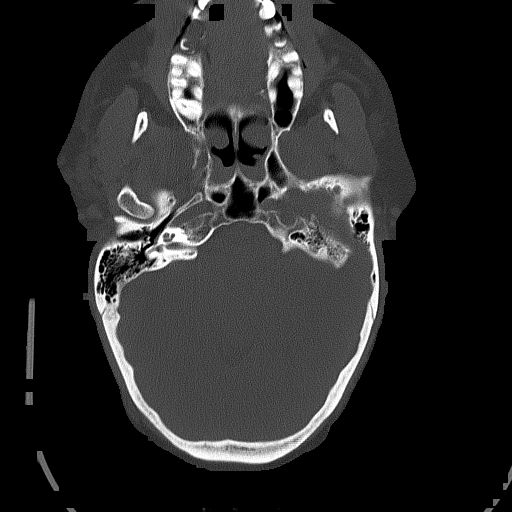
[im 40/88  bone]
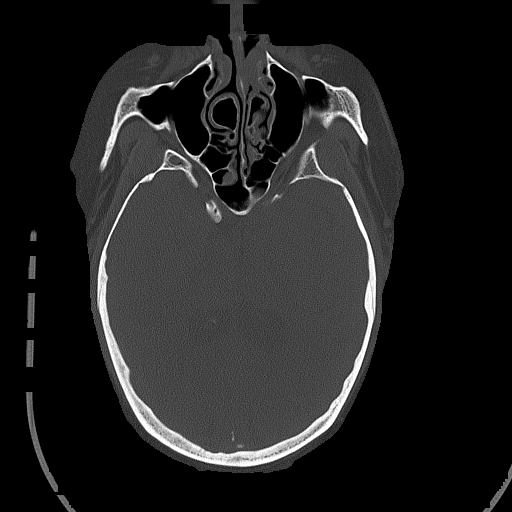
[im 48/88  bone]
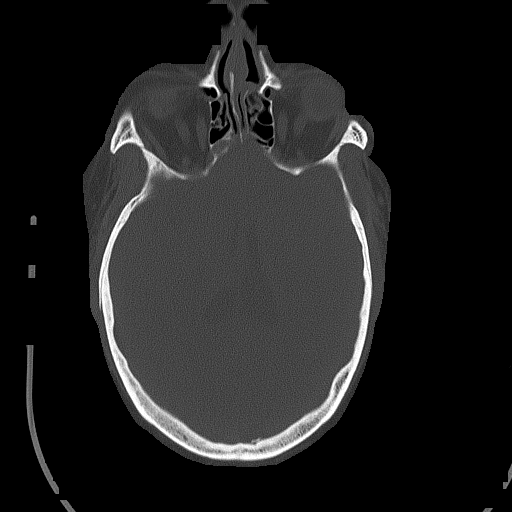
[im 61/88  bone]
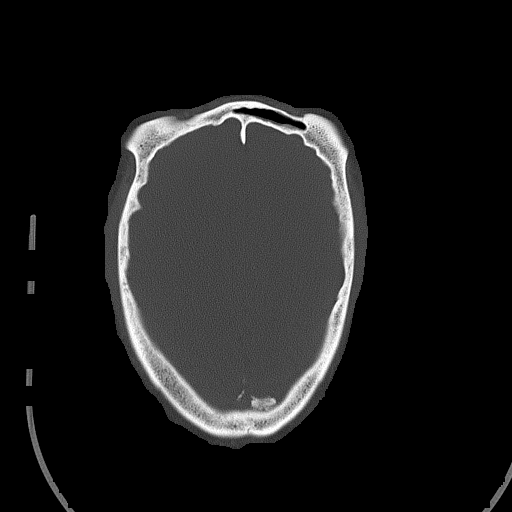
[im 70/88  bone]
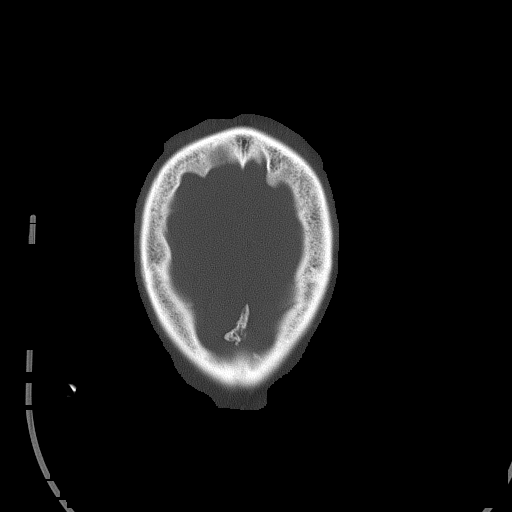

[Series 4: head without · axial · non-contrast · 0.45mm/px · z∈[-108,+18]mm · 7 of 35 slices shown, 9 images]
[im 5/35  brain]
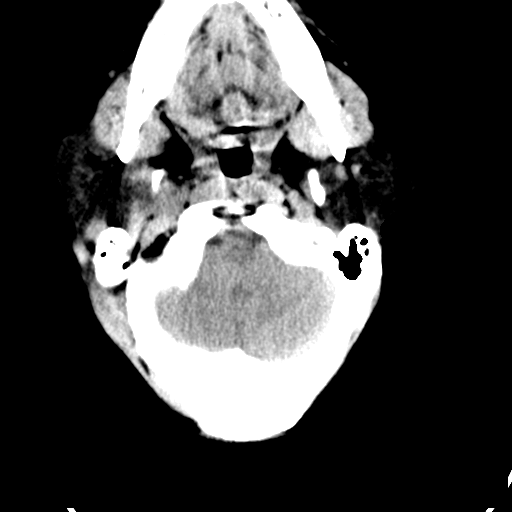
[im 5/35  bone]
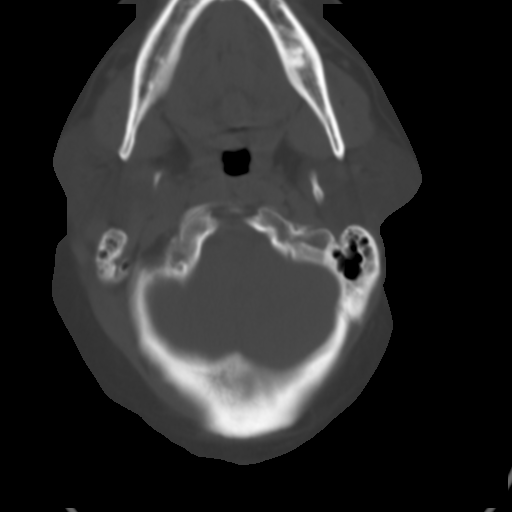
[im 9/35  brain]
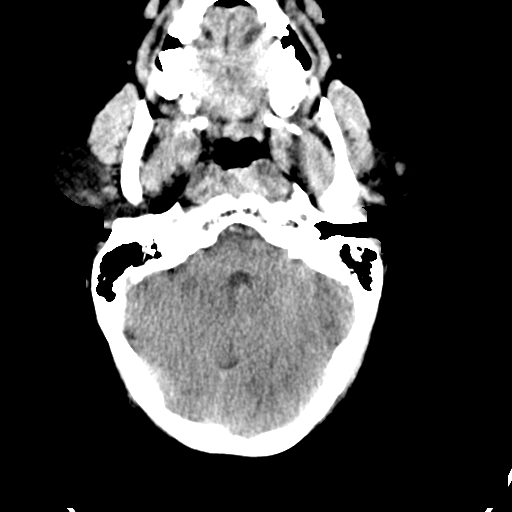
[im 13/35  brain]
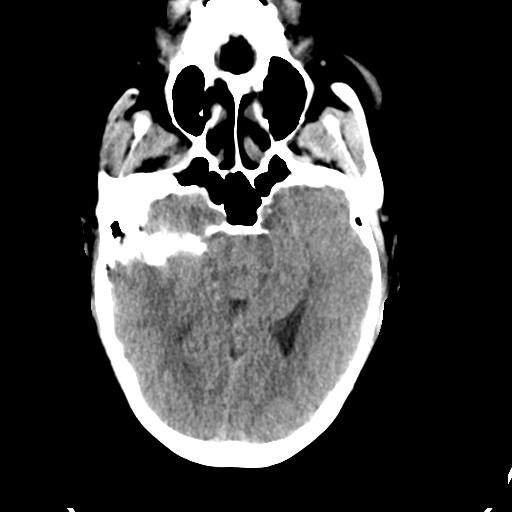
[im 18/35  brain]
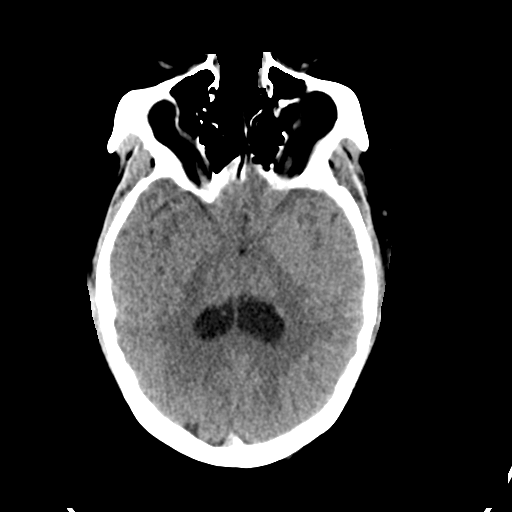
[im 22/35  brain]
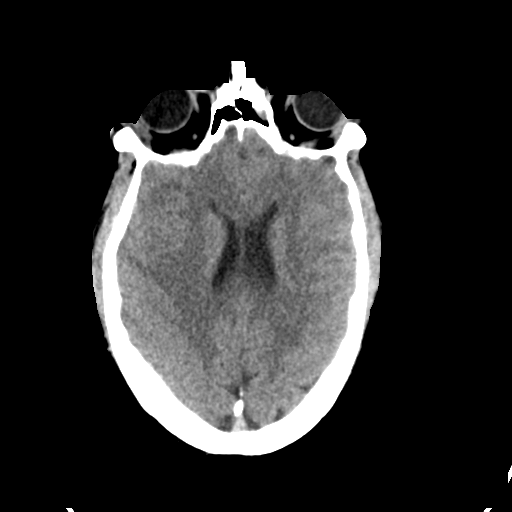
[im 22/35  bone]
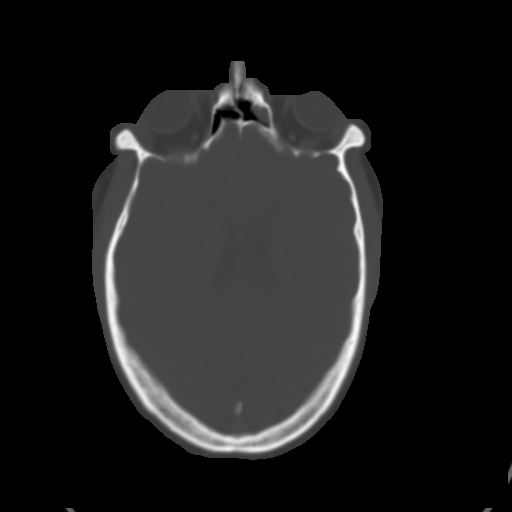
[im 26/35  brain]
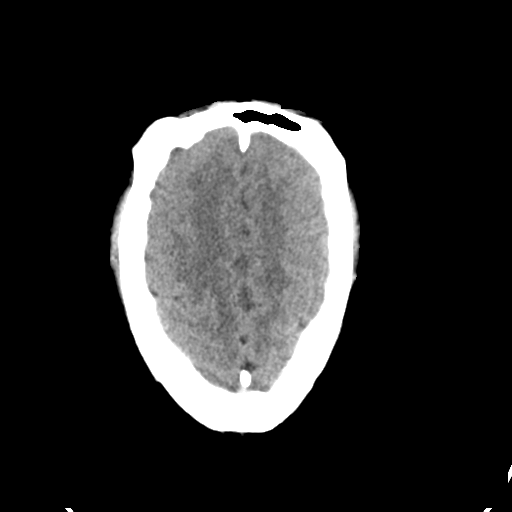
[im 30/35  brain]
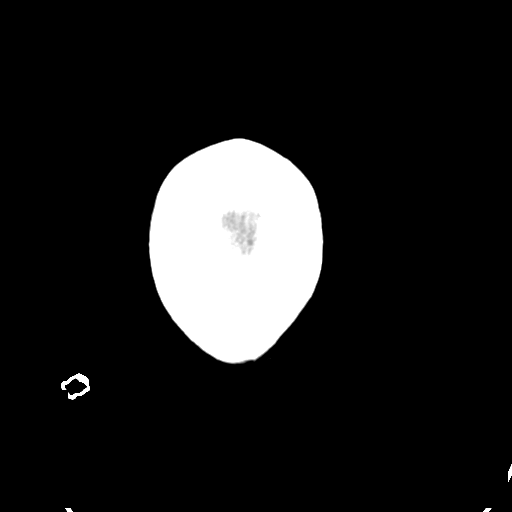

[Series 6: head without sag · sagittal · non-contrast · 0.35mm/px · 3 of 54 slices shown]
[im 18/54  brain]
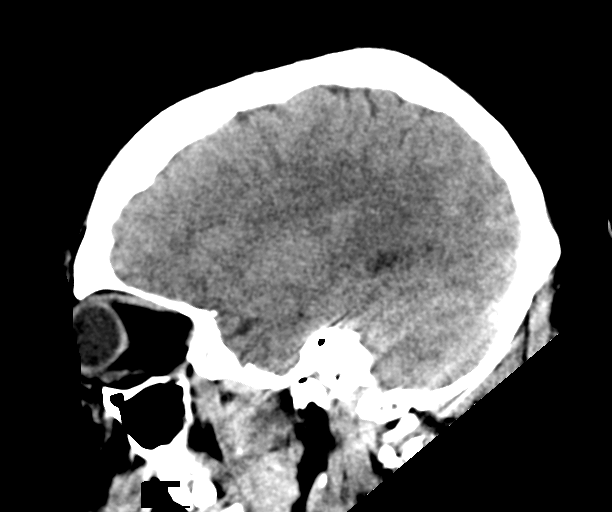
[im 27/54  brain]
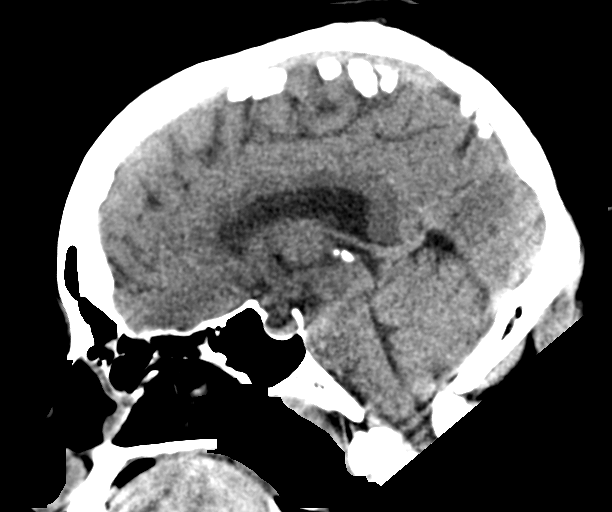
[im 36/54  brain]
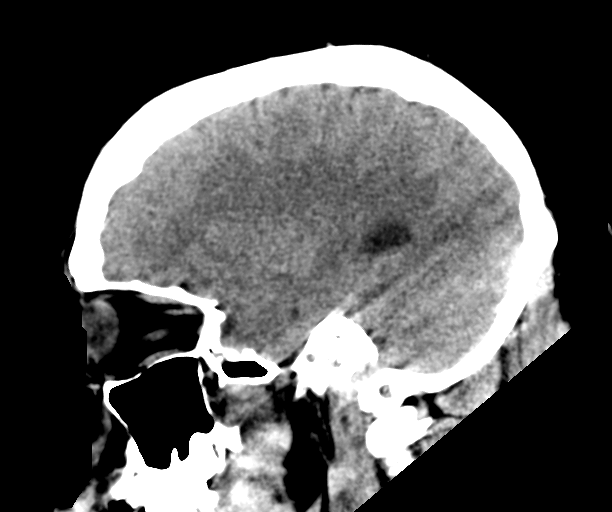

[17 of 37 positions shown; findings below may reference images not displayed]

FINDINGS: Brain: Minimal motion degradation inferiorly. No mass lesion,
hemorrhage, hydrocephalus, acute infarct, intra-axial, or
extra-axial fluid collection.

Vascular: No hyperdense vessel or unexpected calcification.

Skull: No significant soft tissue swelling.  No skull fracture.

Sinuses/Orbits: Normal imaged portions of the orbits and globes.
Mucosal thickening ethmoid air cells. Clear mastoid air cells.
Hypoplastic right frontal sinus.

Other: None.
IMPRESSION: 1. Mild motion degradation inferiorly. Given this limitation, no
acute intracranial abnormality.
2. Sinus disease.

## 2021-11-30 MED ORDER — LEVETIRACETAM IN NACL 1000 MG/100ML IV SOLN
1000.0000 mg | Freq: Once | INTRAVENOUS | Status: AC
  Administered 2021-11-30: 1000 mg via INTRAVENOUS
  Filled 2021-11-30: qty 100

## 2021-11-30 MED ORDER — MIDAZOLAM HCL (PF) 5 MG/ML IJ SOLN: 2.0000 mg | Freq: Once | INTRAMUSCULAR | Status: DC

## 2021-11-30 MED ORDER — MIDAZOLAM HCL 2 MG/2ML IJ SOLN
2.0000 mg | Freq: Once | INTRAMUSCULAR | Status: AC
  Administered 2021-11-30: 2 mg via INTRAVENOUS

## 2021-11-30 MED ORDER — ACETAMINOPHEN 325 MG PO TABS: 650.0000 mg | ORAL_TABLET | ORAL | Status: AC

## 2021-11-30 MED ORDER — LORAZEPAM 2 MG/ML IJ SOLN: 4.0000 mg | INTRAMUSCULAR | Status: DC | PRN

## 2021-11-30 MED ORDER — ENOXAPARIN SODIUM 40 MG/0.4ML IJ SOSY: 40.0000 mg | PREFILLED_SYRINGE | INTRAMUSCULAR | Status: DC

## 2021-11-30 MED ORDER — ORAL CARE MOUTH RINSE: 15.0000 mL | OROMUCOSAL | Status: DC

## 2021-11-30 MED ORDER — MIDAZOLAM HCL 2 MG/2ML IJ SOLN
INTRAMUSCULAR | Status: AC
  Filled 2021-11-30: qty 2

## 2021-11-30 MED ORDER — ACETAMINOPHEN 500 MG PO TABS
1000.0000 mg | ORAL_TABLET | Freq: Four times a day (QID) | ORAL | Status: DC | PRN
  Filled 2021-11-30 (×2): qty 2

## 2021-11-30 MED ORDER — FENTANYL CITRATE PF 50 MCG/ML IJ SOSY
50.0000 ug | PREFILLED_SYRINGE | Freq: Once | INTRAMUSCULAR | Status: DC
Start: 2021-11-30 — End: 2021-11-30

## 2021-11-30 MED ORDER — CHLORHEXIDINE GLUCONATE 0.12% ORAL RINSE (MEDLINE KIT): 15.0000 mL | Freq: Two times a day (BID) | OROMUCOSAL | Status: DC

## 2021-11-30 MED ORDER — SODIUM CHLORIDE 0.9 % IV BOLUS
1000.0000 mL | Freq: Once | INTRAVENOUS | Status: AC
  Administered 2021-11-30: 1000 mL via INTRAVENOUS

## 2021-11-30 MED ORDER — ALBUTEROL SULFATE (2.5 MG/3ML) 0.083% IN NEBU: 2.5000 mg | INHALATION_SOLUTION | RESPIRATORY_TRACT | Status: DC

## 2021-11-30 MED ORDER — RIVAROXABAN 10 MG PO TABS: 10.0000 mg | ORAL_TABLET | Freq: Every day | ORAL | Status: DC

## 2021-11-30 MED ORDER — ACETAMINOPHEN 325 MG PO TABS
ORAL_TABLET | ORAL | Status: AC
  Administered 2021-11-30: 650 mg via ORAL
  Filled 2021-11-30: qty 2

## 2021-11-30 NOTE — ED Notes (Signed)
Attempted to place an NPA with no success. Pt wakes up and removes NPA when RN attempted to place one multiple times. Will continue to monitor.

## 2021-11-30 NOTE — ED Provider Notes (Signed)
[facility] EMERGENCY DEPARTMENT Provider Note   CSN: [id number] Arrival date & time: 11/30/21  1102     History  Chief Complaint  Patient presents with   Altered Mental Status    Jamie Mclaughlin is a 41 y.o. female brought in by EMS for syncopal episode earlier this morning.  Patient reportedly went to the bathroom to urinate and then woke up to EMS assisting her.  Per EMS report, patient had what seemed to be a postictal phase after this episode.  She was confused and unable to follow commands.  This steadily improved more being transported to the emergency department.  Patient states her roommates are the ones that called EMS.  She denies history of seizures or similar symptoms.  She now endorses a 10/10 global headache.  She denies numbness, tingling fever, chills, nausea, vomiting, diarrhea and urinary symptoms.   Altered Mental Status Associated symptoms: headaches   Associated symptoms: no abdominal pain, no fever, no rash and no vomiting       Home Medications Prior to Admission medications   Not on File      Allergies    Patient has no allergy information on record.    Review of Systems   Review of Systems  Constitutional:  Negative for fever.  HENT: Negative.    Eyes: Negative.   Respiratory:  Negative for shortness of breath.   Cardiovascular: Negative.   Gastrointestinal:  Negative for abdominal pain and vomiting.  Endocrine: Negative.   Genitourinary: Negative.   Musculoskeletal: Negative.   Skin:  Negative for rash.  Neurological:  Positive for syncope and headaches.  All other systems reviewed and are negative.  Physical Exam Updated Vital Signs BP 131/85    Pulse 99    Temp 97.7 F (36.5 C) (Oral)    Resp 17    LMP  (LMP Unknown) Comment: irregular   SpO2 99%  Physical Exam Constitutional:      Comments: Patient is morbidly obese, fatigued and difficult to keep awake during exam  HENT:     Head:     Comments: Normocephalic  and atraumatic.     Nose: Nose normal.     Mouth/Throat:     Mouth: Mucous membranes are moist.  Eyes:     Extraocular Movements: Extraocular movements intact.     Conjunctiva/sclera: Conjunctivae normal.     Comments: No horizontal, vertical or rotational nystagmus   Neck:     Comments: Normal range of motion. Neck supple.  Full active and passive ROM without pain No midline or paraspinal tenderness No nuchal rigidity or meningeal signs  Cardiovascular:     Rate and Rhythm: Regular rhythm. Tachycardia present.     Pulses: Normal pulses.     Heart sounds: Normal heart sounds.     Comments: Normal rate, regular rhythm; 2+ distal pulses of the upper and lower extremities bilaterally  Pulmonary:     Effort: Pulmonary effort is normal.     Breath sounds: Normal breath sounds.     Comments: Effort normal and breath sounds normal. No respiratory distress. Pt has no wheezes. No rales. No accessor muscle usage Abdominal:     Comments: Soft. Bowel sounds are normal. There is no tenderness. There is no rebound and no guarding.  No ecchymosis, swelling, edema, or wounds  Musculoskeletal:        General: No swelling, tenderness or signs of injury. Normal range of motion.     Cervical back: Normal range of  motion and neck supple. No rigidity.  Skin:    General: Skin is warm and dry.  Neurological:     General: No focal deficit present.     Mental Status: She is alert and oriented to person, place, and time.     Motor: No weakness.     Comments: No cranial nerve deficit.  Exhibits normal muscle tone. Coordination normal.  Mental Status:  Alert, oriented, thought content appropriate. Speech fluent without evidence of aphasia. Able to follow 2 step commands without difficulty.  Cranial Nerves:  II:  Peripheral visual fields grossly normal, pupils equal, round, reactive to light III,IV, VI: ptosis not present, extra-ocular motions intact bilaterally  V,VII: smile symmetric, facial light touch  sensation equal VIII: hearing grossly normal bilaterally  IX,X: midline uvula rise  XI: bilateral shoulder shrug equal and strong XII: midline tongue extension  Motor:  5/5 in upper and lower extremities bilaterally including strong and equal grip strength and dorsiflexion/plantar flexion Sensory:  light touch normal in all extremities.  Cerebellar: normal finger-to-nose with bilateral upper extremities   Psychiatric:        Mood and Affect: Mood normal.        Behavior: Behavior normal.    ED Results / Procedures / Treatments   Labs (all labs ordered are listed, but only abnormal results are displayed) Labs Reviewed  COMPREHENSIVE METABOLIC PANEL - Abnormal; Notable for the following components:      Result Value   CO2 20 (*)    Glucose, Bld 117 (*)    Calcium 8.8 (*)    All other components within normal limits  CBC - Abnormal; Notable for the following components:   RBC 5.14 (*)    RDW 16.2 (*)    All other components within normal limits  RAPID URINE DRUG SCREEN, HOSP PERFORMED - Abnormal; Notable for the following components:   Tetrahydrocannabinol POSITIVE (*)    All other components within normal limits  URINALYSIS, ROUTINE W REFLEX MICROSCOPIC - Abnormal; Notable for the following components:   Color, Urine STRAW (*)    Hgb urine dipstick MODERATE (*)    Protein, ur 30 (*)    Bacteria, UA RARE (*)    All other components within normal limits  CBG MONITORING, ED - Abnormal; Notable for the following components:   Glucose-Capillary 119 (*)    All other components within normal limits  PREGNANCY, URINE  ETHANOL  CBG MONITORING, ED  I-STAT BETA HCG BLOOD, ED ([facility], [facility], [facility] ONLY)  TROPONIN I (HIGH SENSITIVITY)  TROPONIN I (HIGH SENSITIVITY)    EKG EKG Interpretation  Date/Time:  Monday November 30 2021 11:11:54 EST Ventricular Rate:  117 PR Interval:  166 QRS Duration: 93 QT Interval:  338 QTC Calculation: 472 R Axis:   -41 Text Interpretation: Sinus  tachycardia Ventricular premature complex Biatrial enlargement Left axis deviation Anterior infarct, old No previous ECGs available Confirmed by Vanetta MuldersZackowski, Scott [phone]([id number]) on 11/30/2021 11:37:46 AM  Radiology DG Chest Port 1 View  Result Date: 11/30/2021 CLINICAL DATA:  Headache, found unresponsive on bathroom floor EXAM: PORTABLE CHEST 1 VIEW COMPARISON:  None. FINDINGS: Top-normal heart size. Normal mediastinal contour. No pneumothorax. No pleural effusion. Lungs appear clear, with no acute consolidative airspace disease and no pulmonary edema. IMPRESSION: No active disease. Electronically Signed   By: Delbert PhenixJason A Poff M.D.   On: 11/30/2021 13:51    Procedures .Critical Care Performed by: Janell Quietonklin, Javaun Dimperio R, PA-C Authorized by: Janell Quietonklin, Jadarius Commons R, PA-C   Critical care provider  statement:    Critical care time (minutes):  30   Critical care start time:  11/30/2021 2:45 PM   Critical care end time:  11/30/2021 3:15 PM   Critical care was necessary to treat or prevent imminent or life-threatening deterioration of the following conditions:  CNS failure or compromise   Critical care was time spent personally by me on the following activities:  Development of treatment plan with patient or surrogate, evaluation of patient's response to treatment, examination of patient, ordering and review of laboratory studies, ordering and review of radiographic studies, ordering and performing treatments and interventions, pulse oximetry, re-evaluation of patient's condition and review of old charts   I assumed direction of critical care for this patient from another provider in my specialty: no      Medications Ordered in ED Medications  acetaminophen (TYLENOL) tablet 1,000 mg (has no administration in time range)  midazolam (VERSED) 2 MG/2ML injection (has no administration in time range)  midazolam (VERSED) injection 2 mg (2 mg Intravenous Given 11/30/21 1419)    ED Course/ Medical Decision Making/ A&P                            Medical Decision Making Amount and/or Complexity of Data Reviewed Labs: ordered. Radiology: ordered.  Risk OTC drugs. Prescription drug management.   History:  Per HPI  Initial impression:  This patient presents to the ED for concern of altered mental status with syncopal episode and possible seizure. this involves an extensive number of treatment options, and is a complaint that carries with it a high risk of complications and morbidity.  The differential diagnosis for includes but is not limited to idiopathic seizure, traumatic brain injury, intracranial hemorrhage, vascular lesion, mass or space containing lesion, degenerative neurologic disease, congenital brain abnormality, infectious etiology such as meningitis, encephalitis or abscess, metabolic disturbance including hyper or hypoglycemia, hyper or hyponatremia, hyperosmolar state, uremia, hepatic failure, hypocalcemia, hypomagnesemia.  Toxic substances such as cocaine, lidocaine, antidepressants, theophylline, alcohol withdrawal, drug withdrawal, eclampsia, hypertensive encephalopathy and anoxic brain injury.  ED Course: This is a morbidly obese 41 year old female with no prior history of seizures who presents for unwitnessed syncopal episode, suspect seizure.  On exam, she was fatigued and difficult to keep awake.  She was nontoxic in no acute distress although she was slightly tachycardic.  Neuro exam without focal deficits.  She does endorse a severe global headache that started after she arrived to the emergency department.  I gave her 1 g Tylenol and ordered CT head, chest x-ray and labs. Labs without evidence of heart damage, negative ethanol levels.  Negative pregnancy.  She is negative for drugs aside from [facility].  UA without infection, possible early rhabdo.  CBC without leukocytosis.  Chest x-ray with no acute findings When patient was taken for CT, she began actively seizing.  She was brought back to her room where I  visualized her foaming at the mouth with tonic-clonic muscle contracture.  She was tachycardic to the 130s.  She was given 2 mg Versed with improvement in symptoms.  1 L sodium chloride bolus was administered.  Keppra loading at 4 g initiated per pharmacy consult.  Although patient was stable, she was snoring significantly likely due from positioning.  Oxygen was 100% on room air.  Eyes with pinpoint pupils although UDS negative for opiates.  It was determined that her breathing effort needed to stabilize prior to reattempting CT head.  Lab Tests and EKG:  I Ordered, reviewed, and interpreted labs and EKG.  The pertinent results are listed above   Imaging Studies ordered:  I ordered imaging studies including CT head which is pending at time of discharge.  Chest x-ray without acute findings I independently visualized and interpreted imaging and I agree with the radiologist interpretation.    Cardiac Monitoring:  The patient was maintained on a cardiac monitor.  I personally viewed and interpreted the cardiac monitored which showed an underlying rhythm of: NSR 2 sinus tach   Medicines ordered and prescription drug management:  I ordered medication including: Midazolam 2 mg for seizing Keppra 4 g bolus for seizing 1 L NS fluid bolus Tylenol 1 g for headache Reevaluation of the patient after these medicines showed that the patient resolved I have reviewed the patients home medicines and have made adjustments as needed   Critical Interventions:  CNS compromise as described above  Disposition:  New onset seizure: At time of shift change, I handed care off to [employer].  Patient is pending CT head to identify any visible causes of her new seizures.  Concern for intracranial bleed versus epilepsy versus neoplasm.  Plan at time of shift change is to monitor patient for new seizures- after CT, consult neurology to determine admission versus outpatient follow-up.   Final  Clinical Impression(s) / ED Diagnoses Final diagnoses:  AMS (altered mental status)    Rx / DC Orders ED Discharge Orders     None         Janell Quiet, PA-C 11/30/21 1536    Vanetta Mulders, MD 12/03/21 [phone]

## 2021-11-30 NOTE — ED Notes (Signed)
Pt continues to be drowsy. Easy to arouse. Pt observed snoring. Refused NPA. VS stable. Will continue to monitor.

## 2021-11-30 NOTE — ED Notes (Signed)
Taken to xray by transporter.  °

## 2021-11-30 NOTE — Consult Note (Addendum)
NEUROLOGY CONSULTATION NOTE   Date of service: November 30, 2021 Patient Name: Jamie Mclaughlin MRN:  [id number] DOB:  [birth year] Reason for consult: "Seizure" Requesting Provider: Miguel Aschoff, MD _ _ _   _ __   _ __ _ _  __ __   _ __   __ _  History of Present Illness  Jamie Mclaughlin is a 41 y.o. female with PMH of asthma, HTN who was brought in by EMS after being found confused in the bathroom. She remembers going to the bathroom to urinate and the next thing she remembers is waking up to EMS.  She was brought in to the ED where she was alert and oriented x 4 and reported a headache.  In the ED, she had a 2 mins episode of stopped answering questions, blank stare and lip smacking. She had a second episode concerning for a seizure in the ED with upper body shaking, tongue biting and snoring. She was given IV Versed 2mg . She was drowsy vs post ictal following the event.  Workup with CTH without contrast with no acute abnormalities. UDS positive for THC. Vital with no fever, COVId negative, normoglycemic, UA not concerning for a UTI, beta HCG negative, CXR not concerning for a Pneumonia.  Smokes marijuana, drinks alcohol socially, no prior history of seizures, brother had a seizure he was a kid, no significant head injury with loss of consciousness, endorses significant sleep deprivation with snoring and excessive daytime sleepiness.  Endorses bitemporal headache that is throbbing with photophobia. Sometimes she wakes up with this headache. Headache is worse with cough.  ROS   Constitutional Denies weight loss, fever and chills.   HEENT Denies changes in vision and hearing.   Respiratory Denies SOB and cough.   CV Denies palpitations and CP   GI Denies abdominal pain, nausea, vomiting and diarrhea.   GU Denies dysuria and urinary frequency.   MSK Denies myalgia and joint pain.   Skin Denies rash and pruritus.   Neurological Denies headache and syncope.   Psychiatric  Denies recent changes in mood. Denies anxiety and depression.    Past History   Past Medical History:  Diagnosis Date   Asthma    History of ectopic pregnancy    History of miscarriage    Hypertension    Marijuana use    Tobacco use    Family History  Problem Relation Age of Onset   Diabetes Mother    Prostate cancer Father    Social History   Socioeconomic History   Marital status: Single    Spouse name: Not on file   Number of children: Not on file   Years of education: Not on file   Highest education level: Not on file  Occupational History   Not on file  Tobacco Use   Smoking status: Every Day    Packs/day: 0.25    Types: Cigarettes   Smokeless tobacco: Not on file  Substance and Sexual Activity   Alcohol use: Yes    Comment: 1-2x/month   Drug use: Yes    Types: Marijuana    Comment: Previous hx of cocaine use   Sexual activity: Not on file  Other Topics Concern   Not on file  Social History Narrative   Not on file   Social Determinants of Health   Financial Resource Strain: Not on file  Food Insecurity: Not on file  Transportation Needs: Not on file  Physical Activity: Not on file  Stress: Not  on file  Social Connections: Not on file   Not on File  Medications  (Not in a hospital admission)    Vitals   Vitals:   11/30/21 1500 11/30/21 1645 11/30/21 1737 11/30/21 1830  BP: 122/68 (!) 168/104 (!) 156/97 128/82  Pulse: (!) 109 99 95 86  Resp: (!) 25 (!) 23 18 15   Temp:      TempSrc:      SpO2: 100% 100% 98% 98%     There is no height or weight on file to calculate BMI.  Physical Exam   General: Laying comfortably in bed; in no acute distress.  HENT: Normal oropharynx and mucosa. Normal external appearance of ears and nose.  Neck: Supple, no pain or tenderness  CV: No JVD. No peripheral edema.  Pulmonary: Symmetric Chest rise. Normal respiratory effort.  Abdomen: Soft to touch, non-tender.  Ext: No cyanosis, edema, or deformity   Skin: No rash. Normal palpation of skin.   Musculoskeletal: Normal digits and nails by inspection. No clubbing.   Neurologic Examination  Mental status/Cognition: Alert, oriented to self, place, month and year, good attention.  Speech/language: Fluent, comprehension intact, object naming intact, repetition intact.  Cranial nerves:   CN II Pupils equal and reactive to light, no VF deficits    CN III,IV,VI EOM intact, no gaze preference or deviation, no nystagmus    CN V normal sensation in V1, V2, and V3 segments bilaterally    CN VII no asymmetry, no nasolabial fold flattening    CN VIII normal hearing to speech    CN IX & X normal palatal elevation, no uvular deviation    CN XI 5/5 head turn and 5/5 shoulder shrug bilaterally    CN XII midline tongue protrusion    Motor:  Muscle bulk: normal, tone normal, pronator drift none tremor none Mvmt Root Nerve  Muscle Right Left Comments  SA C5/6 Ax Deltoid 5 5   EF C5/6 Mc Biceps 5 5   EE C6/7/8 Rad Triceps 5 5   WF C6/7 Med FCR     WE C7/8 PIN ECU     F Ab C8/T1 U ADM/FDI 5 5   HF L1/2/3 Fem Illopsoas 5 5   KE L2/3/4 Fem Quad 5 5   DF L4/5 D Peron Tib Ant 5 5   PF S1/2 Tibial Grc/Sol 5 5    Reflexes:  Right Left Comments  Pectoralis      Biceps (C5/6) 1 1   Brachioradialis (C5/6) 1 1    Triceps (C6/7) 1 1    Patellar (L3/4) 1 1    Achilles (S1)      Hoffman      Plantar     Jaw jerk    Sensation:  Light touch Intact throughout   Pin prick    Temperature    Vibration   Proprioception    Coordination/Complex Motor:  - Finger to Nose intact BL - Heel to shin intact BL - Rapid alternating movement are slowed in LUE mildly. - Gait: Deferred for patient safety.  Labs   CBC:  Recent Labs  Lab 11/30/21 1130  WBC 10.1  HGB 14.1  HCT 44.2  MCV 86.0  PLT 292    Basic Metabolic Panel:  Lab Results  Component Value Date   NA 136 11/30/2021   K 4.3 11/30/2021   CO2 20 (L) 11/30/2021   GLUCOSE 117 (H)  11/30/2021   BUN 12 11/30/2021   CREATININE 0.73 11/30/2021  CALCIUM 8.8 (L) 11/30/2021   GFRNONAA >60 11/30/2021   Lipid Panel: No results found for: LDLCALC HgbA1c: No results found for: HGBA1C Urine Drug Screen:     Component Value Date/Time   LABOPIA NONE DETECTED 11/30/2021 1152   COCAINSCRNUR NONE DETECTED 11/30/2021 1152   LABBENZ NONE DETECTED 11/30/2021 1152   AMPHETMU NONE DETECTED 11/30/2021 1152   THCU POSITIVE (A) 11/30/2021 1152   LABBARB NONE DETECTED 11/30/2021 1152    Alcohol Level     Component Value Date/Time   ETH <10 11/30/2021 1131    CT Head without contrast(Personally reviewed): CTH was negative for a large hypodensity concerning for a large territory infarct or hyperdensity concerning for an ICH  MRI Brain: pending  rEEG:  pending  Impression   Jamie Mclaughlin is a 41 y.o. female with PMH significant for asthma, HTN who was brought in by EMS after being found confused in the bathroom that resolved, had a witnessed seizure in the ED. I suspect that her earlier episode was probably a seizures. Her neurologic examination is notable for no focal deficit, she is completely back to her baseline.  No obvious provoking risk factors except that she is chronically sleep deprived, sibling had seizures as a kid and smokes marijuana.  Impression: - first time seizure  Recommendations  - Recommend MRI Brain without contrast along with MR venogram head. - recommend routine EEG. - Hold off on AEDs at this time. If the MRI brain shows structural abnormalities, or routine EEG shows epileptogenic abnormalities, would recommend starting Keppra 500mg  BID - Seizure precautions - Follow up with Neurology outpatient - Follow up with sleep clinic outpatient for sleep study and further workup for poor sleep quality and concern for underlying sleep apnea. - Counseled her extensively on the importance of quitting smoking and marijuana. - Discussed driving  restrictions. No driving for 6 months. She has to be free from any seizure for more than 6 months before she can resume driving. - Observe overnight for seizure clustering. - Full seizure precautions listed below. ______________________________________________________________________   Thank you for the opportunity to take part in the care of this patient. If you have any further questions, please contact the neurology consultation attending.  Signed,  [facility] Pager Number Erick Blinks _ _ _   _ __   _ __ _ _  __ __   _ __   __ _   Seizure precautions: Per [facility] statutes, patients with seizures are not allowed to drive until they have been seizure-free for six months and cleared by a physician    Use caution when using heavy equipment or power tools. Avoid working on ladders or at heights. Take showers instead of baths. Ensure the water temperature is not too high on the home water heater. Do not go swimming alone. Do not lock yourself in a room alone (i.e. bathroom). When caring for infants or small children, sit down when holding, feeding, or changing them to minimize risk of injury to the child in the event you have a seizure. Maintain good sleep hygiene. Avoid alcohol.    If patient has another seizure, call 911 and bring them back to the ED if: A.  The seizure lasts longer than 5 minutes.      B.  The patient doesn't wake shortly after the seizure or has new problems such as difficulty seeing, speaking or moving following the seizure C.  The patient was injured during the seizure D.  The patient has a temperature over 102 F (39C) E.  The patient vomited during the seizure and now is having trouble breathing    During the Seizure   - First, ensure adequate ventilation and place patients on the floor on their left side  Loosen clothing around the neck and ensure the airway is patent. If the patient is clenching the teeth, do not force the  mouth open with any object as this can cause severe damage - Remove all items from the surrounding that can be hazardous. The patient may be oblivious to what's happening and may not even know what he or she is doing. If the patient is confused and wandering, either gently guide him/her away and block access to outside areas - Reassure the individual and be comforting - Call 911. In most cases, the seizure ends before EMS arrives. However, there are cases when seizures may last over 3 to 5 minutes. Or the individual may have developed breathing difficulties or severe injuries. If a pregnant patient or a person with diabetes develops a seizure, it is prudent to call an ambulance. - Finally, if the patient does not regain full consciousness, then call EMS. Most patients will remain confused for about 45 to 90 minutes after a seizure, so you must use judgment in calling for help. - Avoid restraints but make sure the patient is in a bed with padded side rails - Place the individual in a lateral position with the neck slightly flexed; this will help the saliva drain from the mouth and prevent the tongue from falling backward - Remove all nearby furniture and other hazards from the area - Provide verbal assurance as the individual is regaining consciousness - Provide the patient with privacy if possible - Call for help and start treatment as ordered by the caregiver    After the Seizure (Postictal Stage)   After a seizure, most patients experience confusion, fatigue, muscle pain and/or a headache. Thus, one should permit the individual to sleep. For the next few days, reassurance is essential. Being calm and helping reorient the person is also of importance.   Most seizures are painless and end spontaneously. Seizures are not harmful to others but can lead to complications such as stress on the lungs, brain and the heart. Individuals with prior lung problems may develop labored breathing and respiratory  distress.

## 2021-11-30 NOTE — ED Triage Notes (Signed)
Pt was found unresponsive on the bathroom floor. Pt remembers going to the bathroom to urinate then woke up to EMS assisting her. Pt was initially very confused and unable to follow commands per EMS. Pt is now alert and oriented x 4. Reports headache.

## 2021-11-30 NOTE — ED Notes (Signed)
Taken to CT and back by this Charity fundraiser. Pt is now awake, alert and oriented. Reports throbbing headache. Pt states she is going to go home if she does not get anything for headache soon. Explained to pt that she was unresponsive previously due to a seizure episode in CT scan. Pt does not remember events prior to seizure. Will continue to monitor.

## 2021-11-30 NOTE — Hospital Course (Signed)
Doesn't remember much, just remembers going to the bathroom  Reports headache, difficulty swallowing, tongue swelling, feeling confused  Remembers it was daylight when she went to bathroom  Usually just 47min-couple of hours of sleep nightly  After she sat down on toilet she doesn't remember anything that happen; she then remembers EMS  Has not been keeping up with her blood pressure medications regularly  No known hx of seizures. Thinks her brother might have had seizures in high school (currently 3).   Headache since she woke up. Feels like a usual. Not worst headache.   Feels like her arms are weak right now and her toes are tingling.   No fevers, chills, rash, flu-like symptoms, dyspnea, nausea, or vomiting.  Does have some wheezing, but this is her baseline.   She does have some baseline urinary incontinence. Has been happening "a little while"  She has had two ectopic pregnancies and multiple miscarriages.   Lisinopril  Carvedilol Nifidepine PRN ibuprofen  From [place], [place]. Moved here from [place], only been here for 14 days. Lost her father from prostate cancer recently in August. Currently lives with friends (two people, a friend and her fiance), was previously homeless for a few months.   Smokes cigarettes, takes 3-4d to smoke 1 pack. Does smoke marijuana, about a week or so at last use. Occasional alcohol use, once monthly. No cocaine or other recreational drug use. She has previously used cocaine, but was 2 years ago.   Never had a sleep study done.

## 2021-11-30 NOTE — ED Provider Notes (Signed)
Patient discussed and care taken over from previous provider Conklin PA-C at shift change. See her note for full HPI.   Physical Exam  BP 128/82    Pulse 86    Temp 97.7 F (36.5 C) (Oral)    Resp 15    LMP  (LMP Unknown) Comment: irregular   SpO2 98%   Physical Exam Vitals and nursing note reviewed.  Constitutional:      Appearance: Normal appearance.  HENT:     Head: Normocephalic and atraumatic.  Eyes:     Conjunctiva/sclera: Conjunctivae normal.  Cardiovascular:     Rate and Rhythm: Normal rate and regular rhythm.  Pulmonary:     Effort: Pulmonary effort is normal. No respiratory distress.     Breath sounds: Normal breath sounds.  Abdominal:     General: There is no distension.     Palpations: Abdomen is soft.     Tenderness: There is no abdominal tenderness.  Skin:    General: Skin is warm and dry.  Neurological:     General: No focal deficit present.     Mental Status: She is confused.     Comments: Patient is arousable to verbal stimuli but otherwise sleeping.  She is oriented to person and place, but is still very confused as to why she is in the emergency department.    Procedures  Procedures  ED Course / MDM    Medical Decision Making Amount and/or Complexity of Data Reviewed Labs: ordered. Radiology: ordered.  Risk OTC drugs. Prescription drug management. Decision regarding hospitalization.   Patient is 41 y/o female with no history of seizures who presents to the emergency department for syncopal episode vs first time seizure earlier today.  Patient reportedly went to the bathroom, and woke up to EMS assisting her.  Per EMS report, patient had what seemed to be postictal phase afterwards.  Patient was post ictal for a period upon ER arrival. While in CT scanner at 2:30pm, patient appeared to have additional seizure including upper body shaking, snoring, and blood coming out of the mouth. Versed given and nonrebreather placed.   Plan at time of shift  change was to obtain CT head, consult neurology, and likely admit.  On my evaluation now patient has rhonchorous lung sounds in all fields.  She continues to be tachycardic and tachypneic, with good oxygen saturation on room air.  I have concern for aspiration.  Will obtain additional chest x-ray.  Will obtain respiratory testing for admission.  Consulted neurologist Dr. Viviann Spare who will evaluate the patient after medical admission.  Consulted hospitalist who accepted patient admission. The patient appears reasonably stabilized for admission considering the current resources, flow, and capabilities available in the ED at this time, and I doubt any other [facility] requiring further screening and/or treatment in the ED prior to admission.  I discussed this case with my attending physician Dr. Anitra Lauth who cosigned this note including patient's presenting symptoms, physical exam, and planned diagnostics and interventions. Attending physician stated agreement with plan or made changes to plan which were implemented.     Jeanella Flattery 11/30/21 1841    Gwyneth Sprout, MD 12/03/21 2118

## 2021-11-30 NOTE — ED Notes (Signed)
Pt is requesting pain meds at this time. Upset with RN and states, "I feel like I'm not getting taken cared of. I might just go home." Provider notified. Fentanyl IVP ordered at this time by provider. Pt just came back from cxr. Pt intermittently falls asleep and observed comfortable at this time. Appears in no distress. Will hold off Fentanyl until pt is more awake. Will continue to monitor.

## 2021-11-30 NOTE — ED Notes (Signed)
Pt was in CT scan when CT alerted the ED RN. Pt was found to be having a seizure episode in CT with upper body shaking, snoring and blood coming out of pt mouth. Pt was assisted to her side, suctioned and wheeled back on stretcher to her room. ED provider notified at this time and was at bedside. Versed IVP given. Pt started snoring at this time. Plan is to stabilize pt and go back to CT scan. Nonrebreather at 15lpm  placed at this time. Will continue to monitor.

## 2021-11-30 NOTE — ED Notes (Signed)
RN was talking to pt at this time when pt stopped answering questions. Pt had a blank stare and started smacking lips. Pt did this episode for approximately 29minutes when started asking RN what happened. Denies history of seizures. Pt is now alert and oriented x 4. Will notify MD.

## 2021-11-30 NOTE — H&P (Signed)
Date: 11/30/2021               Patient Name:  Jamie Mclaughlin MRN: [id number]  DOB: 12-09-1980 Age / Sex: 41 y.o., female   PCP: Pcp, No         Medical Service: Internal Medicine Teaching Service         Attending Physician: Dr. Jimmye Norman, Elaina Pattee, MD    First Contact: Dr. Elliot Gurney Pager: [phone]  Second Contact: Dr. Eulas Post Pager: [phone]       After Hours (After 5p/  First Contact Pager: [phone]  weekends / holidays): Second Contact Pager: [phone]   Chief Complaint: Found down, witnessed seizure  History of Present Illness:  Ms. Jamie Mclaughlin is a 41 year old African-American female with past medical history of hypertension, asthma, obesity, suspected OSA, who presented to [facility] ED after being found down and confused at home by roommates who called EMS.  Patient's roommates report speaking with patient this morning, 40 minutes later they went to check on her and found her lying down and confused in the bathroom, and called EMS.  Patient was found to be incontinent of urine at the scene and altered.  During transport patient's mental status slowly improved.  In the ED patient was noted to be confused and drowsy, suspected post ictal period.  At 11:30 AM RN noted patient had blank stare and smacking her lips which occurred for 2 minutes and then return to alert and oriented x4. At 2:30pm patient was in CT scanner when the patient had seizure-like episode, with tonic clonic muscle contracture, sinus tachycardia to 130s, foaming/blood coming out of the mouth.  She was given 2 mg Versed with cessation of seizure-like symptoms.  ED provider spoke with neurology who recommended 4 g Keppra load.  Also given 1 L NS bolus and Tylenol, fentanyl for headache. Some concern patient aspirated during event.  Patient reports she remembers waking up late morning, feeling the urge to use the restroom and had ambulated to the restroom, sat down on the toilet and was unsure what happened  after this.  She reports she woke up with EMS there.  She denies fever, chills, flulike symptoms, dizziness, lightheadedness, shortness of breath, chest pain, nausea, vomiting, rash prior to this episode.  She reports soreness of her tongue which is new and headache.  Soreness is on the left side of her tongue, feels swollen to her.  Her headache is at bilateral temples and is similar to the headache she usually gets most days when she wakes up in the morning.  She reports headache has improved since receiving pain medication in the ED.  Denies phonophobia, photophobia.  Denies previous personal history of seizures.  Reports brother had seizures in grade school and had no further seizures in adulthood, currently 41 years old.  Reports nonadherence to hypertensive regimen recently.  Reports occasional marijuana use and quarter pack per day cigarette use.  Drinks alcohol 1-2 times per month.  Remote history of cocaine use, reports has not use cocaine recently.  Patient moved from [place], [place] 2 weeks ago to be closer to her parents.  Currently lives in [place] with friends, works as a Theatre stage manager at [employer].  Past history of homelessness.  Reports medical history of hypertension, uses inhalers for wheezing, previously told she needs sleep study, used to see counselor for depression.  On carvedilol, nifedipine, lisinopril for blood pressure.  Reports history of insomnia often only getting a few hours of sleep  at night, snores, apneic periods during sleep, morning headaches.  Taking 1-4 tablets of ibuprofen most days for headache. Reports she has been having urinary incontinence for almost a year, only occasionally stress incontinence type, endorses urge incontinence, polydipsia, polyuria.  History of 2 ectopic pregnancies and multiple miscarriages, no full-term pregnancies/vaginal deliveries.  Does not think she has ever had her hemoglobin A1c checked.  Used to follow with cardiology in [place], reports was  previously on diuretic for swelling.  Meds:  Nifedipine, lisinopril, carvedilol (pt unsure what dosage) As needed ibuprofen for headache Inhaler as needed  Allergies: Allergies as of 11/30/2021   (Not on File)   Past Medical History:  Diagnosis Date   Asthma    History of ectopic pregnancy    History of miscarriage    Hypertension    Marijuana use    Tobacco use     Family History:  Family History  Problem Relation Age of Onset   Diabetes Mother    Prostate cancer Father    Social History:  Social History   Tobacco Use   Smoking status: Every Day    Packs/day: 0.25    Types: Cigarettes  Substance Use Topics   Alcohol use: Yes    Comment: 1-2x/month   Drug use: Yes    Types: Marijuana    Comment: Previous hx of cocaine use  Patient moved from [place], [place] 2 weeks ago to be closer to her parents.  Currently lives in [place] with friends (two people, a friend and her fiance), works as a Theatre stage manager at [employer].  Past history of homelessness.   Review of Systems: A complete ROS was negative except as per HPI.   Physical Exam: Blood pressure 128/82, pulse 86, temperature 97.7 F (36.5 C), temperature source Oral, resp. rate 15, SpO2 98 %. Physical Exam: General: Well appearing obese African-American female, NAD HENT: normocephalic, atraumatic, bruising to left side of tongue without active bleeding, no other lesions of the oropharynx, no nuchal rigidity EYES: Tiny conjunctival hemorrhages, no scleral icterus CV: Tachycardia, normal rhythm, no murmurs, rubs, gallops.  No lower extremity edema. Pulmonary: normal work of breathing on RA, diffuse expiratory wheezing Abdominal: non-distended, soft, non-tender to palpation, normal BS Skin: Warm and dry, no rashes or lesions on exposed surfaces MSK: Longer 5th digit nail Neurological: MS: awake, alert and oriented x4 (new exact date), normal speech and fund of knowledge Motor: BUE 5/5, RLE 5/5, left hip flexor 4/5 2/2  pain, otherwise left lower extremity 5/5 Sensation: Intact to light touch No focal neurologic deficits. Psych: normal affect  CBC    Component Value Date/Time   WBC 10.1 11/30/2021 1130   RBC 5.14 (H) 11/30/2021 1130   HGB 14.1 11/30/2021 1130   HCT 44.2 11/30/2021 1130   PLT 292 11/30/2021 1130   MCV 86.0 11/30/2021 1130   MCH 27.4 11/30/2021 1130   MCHC 31.9 11/30/2021 1130   RDW 16.2 (H) 11/30/2021 1130   CMP     Component Value Date/Time   NA 136 11/30/2021 1130   K 4.3 11/30/2021 1130   CL 106 11/30/2021 1130   CO2 20 (L) 11/30/2021 1130   GLUCOSE 117 (H) 11/30/2021 1130   BUN 12 11/30/2021 1130   CREATININE 0.73 11/30/2021 1130   CALCIUM 8.8 (L) 11/30/2021 1130   PROT 8.1 11/30/2021 1130   ALBUMIN 3.6 11/30/2021 1130   AST 24 11/30/2021 1130   ALT 16 11/30/2021 1130   ALKPHOS 99 11/30/2021 1130   BILITOT 0.3  11/30/2021 1130   GFRNONAA >60 11/30/2021 1130   Resp Panel by RT-PCR (Flu A&B, Covid) Nasopharyngeal Swab [id number] Collected: 11/30/21 1737  Specimen: Nasopharyngeal(NP) swabs in vial transport medium from Nasopharyngeal Swab Updated: 11/30/21 1849   SARS Coronavirus 2 by RT PCR NEGATIVE   Influenza A by PCR NEGATIVE   Influenza B by PCR NEGATIVE   Rapid urine drug screen (hospital performed) [id number] (Abnormal) Collected: 11/30/21 1152  Specimen: Urine, Clean Catch Updated: 11/30/21 1229   Opiates NONE DETECTED   Cocaine NONE DETECTED   Benzodiazepines NONE DETECTED   Amphetamines NONE DETECTED   Tetrahydrocannabinol POSITIVE Abnormal    Barbiturates NONE DETECTED   Ethanol [id number] Collected: 11/30/21 1131  Specimen: Blood Updated: 11/30/21 1219   Alcohol, Ethyl (B) <10 mg/dL    Pregnancy, urine [id number] Collected: 11/30/21 1152  Specimen: Urine, Clean Catch Updated: 11/30/21 1212   Preg Test, Ur NEGATIVE   Urinalysis, Routine w reflex microscopic Urine, Clean Catch [id number] (Abnormal) Collected: 11/30/21 1152  Specimen: Urine,  Clean Catch Updated: 11/30/21 1214   Color, Urine STRAW Abnormal    APPearance CLEAR   Specific Gravity, Urine 1.010   pH 5.0   Glucose, UA NEGATIVE mg/dL    Hgb urine dipstick MODERATE Abnormal    Bilirubin Urine NEGATIVE   Ketones, ur NEGATIVE mg/dL    Protein, ur 30 Abnormal  mg/dL    Nitrite NEGATIVE   Leukocytes,Ua NEGATIVE   RBC / HPF 0-5 RBC/hpf    WBC, UA 0-5 WBC/hpf    Bacteria, UA RARE Abnormal    Squamous Epithelial / LPF 0-5   Mucus PRESENT   EKG: personally reviewed my interpretation is sinus tachycardia, LAD, atrial enlargement, one PVC  CXR: personally reviewed my interpretation is no acute cardiopulmonary disease  DG Chest 2 View Result Date: 11/30/2021 FINDINGS: Frontal and lateral views of the chest demonstrate a stable enlarged cardiac silhouette. No acute airspace disease, effusion, or pneumothorax. No acute bony abnormality. IMPRESSION: 1. Stable chest, no acute process. Electronically Signed   By: Randa Ngo M.D.   On: 11/30/2021 17:36   CT Head Wo Contrast Result Date: 11/30/2021 IMPRESSION: 1. Mild motion degradation inferiorly. Given this limitation, no acute intracranial abnormality. 2. Sinus disease. Electronically Signed   By: Abigail Miyamoto M.D.   On: 11/30/2021 16:14   DG Chest Port 1 View Result Date: 11/30/2021 IMPRESSION: No active disease. Electronically Signed   By: Ilona Sorrel M.D.   On: 11/30/2021 13:51   DG HIP UNILAT WITH PELVIS 2-3 VIEWS LEFT Result Date: 11/30/2021 FINDINGS: Frontal view of the pelvis as well as frontal and frogleg lateral views of the left hip are obtained. No acute fracture, subluxation, or dislocation. Joint spaces are well preserved. Soft tissues are unremarkable. IMPRESSION: 1. No acute bony abnormality. Electronically Signed   By: Randa Ngo M.D.   On: 11/30/2021 21:59     Assessment & Plan by Problem: Principal Problem:   New onset seizure [facility]) Ms. Halayah Rappe is a 41 year old African-American  female with past medical history of hypertension, asthma, obesity, suspected OSA, who presented to [facility] ED after being found down and confused at home, had witnessed seizure-like activity in the ED, now back to baseline mental status, and admitted for further seizure work-up.  #First time seizures #Witnessed seizure-like episodes #AMS, resolved Patient found down, had episode of urinary incontinence, was confused and drowsy suspicious for postictal period following event.  In the ED, initially afebrile and hemodynamically stable, then had 2-minute  event where she had a blank stare and started smacking her lips then returned to Aox4.  Had reported witnessed seizure in the CT machine with noted clonic tonic type movements, tachycardia 130s, possible tongue biting, which improved after Versed was given.  These events are concerning for epileptic activity with 3 possible epileptic events in the last 24 hours.  No fevers, nuchal rigidity.  CTh without ICH or masses. No history of seizures, patient's brother may have had seizures in childhood with no further events in adulthood.  UDS positive for marijuana otherwise negative.  Reports only occasional alcohol use.  Remote history of cocaine use, long 5th digit nail.  No metabolic derangements, CBC unremarkable, troponins flat, pregnancy test negative.  UA with myoglobin though no evidence of AKI.  Mental status not back to baseline.  ED provider consulted neurology who recommended MRI brain with MR venogram of head, rEEG, holding off on AEDs at this time.  If MRI shows structural abnormalities or if EEG shows epileptic genic abnormalities neurology would recommend starting Keppra 500 twice daily.  Neurology recommends outpatient follow-up. Plan: -Neurology following, appreciate recommendations -f/u rEEG -f/u MRI brain, MR venogram head -S/p Keppra load, holding on maintenance AED at this time -Ativan as needed -Plan to treat with Keppra 500 mg twice daily if MRI  brain shows structural abn or EEG shows epileptogenic abn -Seizure precautions -Outpatient neurology follow-up -Driving restrictions 6 months  #HTN Patient reports history of hypertension previously on carvedilol, lisinopril, nifedipine though reports nonadherence since leaving [place].  Previously followed with a cardiologist for blood pressure management.  Normotensive in the ED.  Patient hopes to establish care with new PCP here in [place], feel she would be a good fit for [facility]. Plan: -Currently normotensive -Can consider addition of one of her previous BP meds if pressures elevate -Establish care with PCP after discharge  #Suspected sleep apnea Patient reports history of poor sleep, orthopnea, apneic periods, severe snoring, morning headaches, also has obesity and this is concerning for sleep apnea.  Patient has previously discussed undergoing a sleep study with former PCP though this was never completed.  STOP-BANG 6.  Plan: -Outpatient sleep study  #Left hip pain #Found down Unwitnessed fall the setting of suspected seizure.  CTh negative for ICH.  Patient endorsing left hip pain to palpation with strength exam of hip flexor limited by hip pain. Plan: -DG L hip, no osseous abnormalities, soft tissues unremarkable  #Polyuria, polydipsia #Urge incontinence Family history diabetes.  Patient reports has never been tested for diabetes.  Blood sugars appropriate on admission.  Also having incontinence over the last several months, will need to be addressed when she establishes with PCP. Plan: -Hemoglobin A1c -Establish with PCP  #Asthma -Schedule albuterol neb  Diet: Regular VTE: Lovenox IVF: None Code: Full  Dispo: Admit patient to Observation with expected length of stay less than 2 midnights.  Portions of this report may have been transcribed using voice recognition software. Every effort was made to ensure accuracy; however, inadvertent computerized transcription errors  may be present.   Signed: Wayland Denis, MD 11/30/2021, 9:53 PM  Pager: [phone] After 5pm on weekdays and 1pm on weekends: On Call pager: [phone]

## 2021-11-30 NOTE — ED Notes (Signed)
Patient transported to X-ray 

## 2021-12-01 ENCOUNTER — Observation Stay (HOSPITAL_COMMUNITY): Payer: Medicaid - Out of State

## 2021-12-01 ENCOUNTER — Other Ambulatory Visit (HOSPITAL_COMMUNITY): Payer: Self-pay

## 2021-12-01 DIAGNOSIS — R569 Unspecified convulsions: Secondary | ICD-10-CM | POA: Diagnosis not present

## 2021-12-01 LAB — CBC
HCT: 38.9 % (ref 36.0–46.0)
Hemoglobin: 13 g/dL (ref 12.0–15.0)
MCH: 27.6 pg (ref 26.0–34.0)
MCHC: 33.4 g/dL (ref 30.0–36.0)
MCV: 82.6 fL (ref 80.0–100.0)
Platelets: 268 10*3/uL (ref 150–400)
RBC: 4.71 MIL/uL (ref 3.87–5.11)
RDW: 15.9 % — ABNORMAL HIGH (ref 11.5–15.5)
WBC: 10.6 10*3/uL — ABNORMAL HIGH (ref 4.0–10.5)
nRBC: 0 % (ref 0.0–0.2)

## 2021-12-01 LAB — BASIC METABOLIC PANEL
Anion gap: 10 (ref 5–15)
BUN: 7 mg/dL (ref 6–20)
CO2: 25 mmol/L (ref 22–32)
Calcium: 8.8 mg/dL — ABNORMAL LOW (ref 8.9–10.3)
Chloride: 103 mmol/L (ref 98–111)
Creatinine, Ser: 0.77 mg/dL (ref 0.44–1.00)
GFR, Estimated: >60 mL/min (ref >60)
Glucose, Bld: 94 mg/dL (ref 70–99)
Potassium: 3 mmol/L — ABNORMAL LOW (ref 3.5–5.1)
Sodium: 138 mmol/L (ref 135–145)

## 2021-12-01 LAB — HIV ANTIBODY (ROUTINE TESTING W REFLEX): HIV Screen 4th Generation wRfx: NONREACTIVE

## 2021-12-01 LAB — HEMOGLOBIN A1C
Hgb A1c MFr Bld: 5.6 % (ref 4.8–5.6)
Mean Plasma Glucose: 114.02 mg/dL

## 2021-12-01 MED ORDER — POTASSIUM CHLORIDE CRYS ER 20 MEQ PO TBCR
40.0000 meq | EXTENDED_RELEASE_TABLET | Freq: Two times a day (BID) | ORAL | Status: DC
Start: 2021-12-01 — End: 2021-12-01
  Administered 2021-12-01: 40 meq via ORAL
  Filled 2021-12-01 (×2): qty 2

## 2021-12-01 MED ORDER — AMLODIPINE BESYLATE 5 MG PO TABS
5.0000 mg | ORAL_TABLET | Freq: Every day | ORAL | 11 refills | Status: AC
  Filled 2021-12-01: qty 30, 30d supply, fill #0

## 2021-12-01 MED ORDER — LISINOPRIL 40 MG PO TABS
40.0000 mg | ORAL_TABLET | Freq: Every day | ORAL | 3 refills | Status: AC
  Filled 2021-12-01: qty 30, 30d supply, fill #0

## 2021-12-01 MED ORDER — ALBUTEROL SULFATE (2.5 MG/3ML) 0.083% IN NEBU: 2.5000 mg | INHALATION_SOLUTION | RESPIRATORY_TRACT | Status: DC | PRN

## 2021-12-01 MED ORDER — ALBUTEROL SULFATE (2.5 MG/3ML) 0.083% IN NEBU
2.5000 mg | INHALATION_SOLUTION | RESPIRATORY_TRACT | Status: DC
  Administered 2021-12-01 (×2): 2.5 mg via RESPIRATORY_TRACT
  Filled 2021-12-01 (×2): qty 3

## 2021-12-01 NOTE — Evaluation (Signed)
Physical Therapy Evaluation Patient Details Name: Jamie Mclaughlin MRN: [id number] DOB: 29-Dec-1980 Today's Date: 12/01/2021  History of Present Illness  Jamie Mclaughlin is a 41 y.o. female who presented after friends found her confused in the bathroom, admitted for seizure evaluation on 11/30/21. Pt had two more seizure-like episodes while in the ED. Work up pending. PMHx: hypertension, asthma, obesity, suspected OSA  Clinical Impression  Pt admitted with above diagnosis. At baseline, pt is independent and working.  She recently moved in with friends and has support intermittently.   Today, pt presenting with mild unsteadiness upon standing but improved throughout session.  She did require min guard for safety .  Was able to perform 5 steps with rails.  DGI score does indicate fall risk.  Strength and coordination equal throughout.  Pt expected to progress well and likely no therapy needs at d/c. Pt currently with functional limitations due to the deficits listed below (see PT Problem List). Pt will benefit from skilled PT to increase their independence and safety with mobility to allow discharge to the venue listed below.          Recommendations for follow up therapy are one component of a multi-disciplinary discharge planning process, led by the attending physician.  Recommendations may be updated based on patient status, additional functional criteria and insurance authorization.  Follow Up Recommendations No PT follow up    Assistance Recommended at Discharge PRN  Patient can return home with the following  Help with stairs or ramp for entrance;Assistance with cooking/housework    Equipment Recommendations None recommended by PT  Recommendations for Other Services       Functional Status Assessment Patient has had a recent decline in their functional status and demonstrates the ability to make significant improvements in function in a reasonable and predictable amount of time.      Precautions / Restrictions Precautions Precautions: Fall Precaution Comments: seizure Restrictions Weight Bearing Restrictions: No      Mobility  Bed Mobility Overal bed mobility: Modified Independent                  Transfers Overall transfer level: Needs assistance Equipment used: None Transfers: Sit to/from Stand Sit to Stand: Min guard           General transfer comment: min guard for safety    Ambulation/Gait Ambulation/Gait assistance: Min assist Gait Distance (Feet): 300 Feet Assistive device: None, 1 person hand held assist Gait Pattern/deviations: Step-through pattern, Decreased stride length Gait velocity: decreased     General Gait Details: Pt initially requiring min A due to unsteadiness progressing to min guard without AD. DId have another LOB when turning and coming out of bathroom but recovered on her own  Stairs Stairs: Yes Stairs assistance: Min guard Stair Management: Two rails, Alternating pattern, Forwards Number of Stairs: 5 General stair comments: decreased speed  Wheelchair Mobility    Modified Rankin (Stroke Patients Only)       Balance Overall balance assessment: Needs assistance Sitting-balance support: Feet supported Sitting balance-Leahy Scale: Good     Standing balance support: No upper extremity supported Standing balance-Leahy Scale: Fair Standing balance comment: Pt with initial unsteadiness but improved without UE support.  Was able to do ADL without support.                 Standardized Balance Assessment Standardized Balance Assessment : Dynamic Gait Index   Dynamic Gait Index Level Surface: Mild Impairment Change in Gait Speed: Mild Impairment Gait  with Horizontal Head Turns: Mild Impairment Gait with Vertical Head Turns: Mild Impairment Gait and Pivot Turn: Mild Impairment Step Over Obstacle: Mild Impairment Step Around Obstacles: Mild Impairment Steps: Mild Impairment Total Score: 16        Pertinent Vitals/Pain Pain Assessment Pain Assessment: Faces Faces Pain Scale: Hurts a little bit Pain Location: L hip Pain Descriptors / Indicators: Discomfort Pain Intervention(s): Monitored during session    Home Living Family/patient expects to be discharged to:: Private residence Living Arrangements: Non-relatives/Friends Available Help at Discharge: Friend(s);Available PRN/intermittently Type of Home: House Home Access: Stairs to enter Entrance Stairs-Rails: None Entrance Stairs-Number of Steps: 3 Alternate Level Stairs-Number of Steps: flight Home Layout: Two level;Able to live on main level with bedroom/bathroom Home Equipment: None Additional Comments: lives with 2 friends, there is "normally" someone at home    Prior Function Prior Level of Function : Independent/Modified Independent;Working/employed;Driving             Mobility Comments: no AD 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, Theatre stage manager   Dominant Hand: Right    Extremity/Trunk Assessment   Upper Extremity Assessment Upper Extremity Assessment: Defer to OT evaluation    Lower Extremity Assessment Lower Extremity Assessment: Overall WFL for tasks assessed (ROM WFL; MMT 5/5; coordination WNL; some difficulty with hip flexion in sitting but likely due to body habitus)    Cervical / Trunk Assessment Cervical / Trunk Assessment: Other exceptions Cervical / Trunk Exceptions: increased body habitus  Communication   Communication: No difficulties  Cognition Arousal/Alertness: Awake/alert Behavior During Therapy: WFL for tasks assessed/performed, Flat affect Overall Cognitive Status: Within Functional Limits for tasks assessed                                 General Comments: O&Ax4. Follows all commands well. Limited insight to deficits. Seeminly anxious within reason for new medical complication. Pt reports that she moved to [place] due to her fathers death and some  family drama that occured proceeding his passing.        General Comments      Exercises     Assessment/Plan    PT Assessment Patient needs continued PT services  PT Problem List Decreased mobility;Decreased safety awareness;Decreased activity tolerance;Decreased balance;Decreased knowledge of use of DME       PT Treatment Interventions DME instruction;Therapeutic activities;Gait training;Therapeutic exercise;Patient/family education;Stair training;Balance training;Functional mobility training;Neuromuscular re-education    PT Goals (Current goals can be found in the Care Plan section)  Acute Rehab PT Goals Patient Stated Goal: return home PT Goal Formulation: With patient Time For Goal Achievement: 12/15/21 Potential to Achieve Goals: Good Additional Goals Additional Goal #1: Pt will score >19 on DGI to indicate low fall risk    Frequency Min 3X/week     Co-evaluation PT/OT/SLP Co-Evaluation/Treatment: Yes Reason for Co-Treatment: For patient/therapist safety (RN reports very lethargic; seizures, no tech time available) PT goals addressed during session: Mobility/safety with mobility OT goals addressed during session: ADL's and self-care       AM-PAC PT "6 Clicks" Mobility  Outcome Measure Help needed turning from your back to your side while in a flat bed without using bedrails?: None Help needed moving from lying on your back to sitting on the side of a flat bed without using bedrails?: A Little Help needed moving to and from a bed to a chair (including a wheelchair)?: A Little Help needed standing  up from a chair using your arms (e.g., wheelchair or bedside chair)?: A Little Help needed to walk in hospital room?: A Little Help needed climbing 3-5 steps with a railing? : A Little 6 Click Score: 19    End of Session Equipment Utilized During Treatment: Gait belt Activity Tolerance: Patient tolerated treatment well Patient left: with chair alarm set;in chair;with  call bell/phone within reach Nurse Communication: Mobility status;Other (comment) (safe to walk to bathroom with staff, did not replace purewick, pt's IV L wrist was out at PT/OT arrival no bleeding) PT Visit Diagnosis: Other abnormalities of gait and mobility (R26.89)    Time: 1287-8676 PT Time Calculation (min) (ACUTE ONLY): 30 min   Charges:   PT Evaluation $PT Eval Low Complexity: 1 Low          Fawn Desrocher, PT Acute Rehab Services Pager [phone] [facility] [phone]   Rayetta Humphrey 12/01/2021, 11:35 AM

## 2021-12-01 NOTE — Progress Notes (Signed)
OT Cancellation Note  Patient Details Name: Jamie Mclaughlin MRN: [id number] DOB: 01-29-1981   Cancelled Treatment:    Reason Eval/Treat Not Completed: Patient at procedure or test/ unavailable (EEG being set up, OT evaluation to f/u as able.)  [name] 12/01/2021, 9:42 AM

## 2021-12-01 NOTE — TOC Transition Note (Signed)
Transition of Care [facility]) - CM/SW Discharge Note   Patient Details  Name: Jamie Mclaughlin MRN: [id number] Date of Birth: Apr 14, 1981  [facility]) CM/SW Contact:  Kermit Balo, RN Phone Number: 12/01/2021, 2:23 PM   Clinical Narrative:    Patient lives at home with roommates who she says are around most of the time. She has recently moved to [facility] and has not changed her medicaid to [facility]. CM has encouraged her to do so if she plans on staying in [place].  No PCP. Internal medicine team has updated CM that they will be seeing the patient in their outpatient clinic.  Pt's discharge medications to be sent to [facility] pharmacy to make sure they are covered.  Pt has transportation home.    Final next level of care: Home/Self Care    Patient Goals and CMS Choice        Discharge Placement                       Discharge Plan and Services   Discharge Planning Services: CM Consult                                 Social Determinants of Health (SDOH) Interventions     Readmission Risk Interventions No flowsheet data found.

## 2021-12-01 NOTE — Discharge Instructions (Addendum)
For your blood pressure we have stopped your COREG (carvedilol) and your nifedipine. Please continue your lisinopril and we started you on a medication called amlodipine. We will schedule you follow up in our clinic. You will also need a sleep study done.    You will need to schedule follow up with [facility]   Address: [address], [place], [place] [address] Hours:  Open ? Closes 5?PM Phone: [phone]    Seizure precautions: Per [facility] statutes, patients with seizures are not allowed to drive until they have been seizure-free for six months and cleared by a physician    Use caution when using heavy equipment or power tools. Avoid working on ladders or at heights. Take showers instead of baths. Ensure the water temperature is not too high on the home water heater. Do not go swimming alone. Do not lock yourself in a room alone (i.e. bathroom). When caring for infants or small children, sit down when holding, feeding, or changing them to minimize risk of injury to the child in the event you have a seizure. Maintain good sleep hygiene. Avoid alcohol.    If patient has another seizure, call 911 and bring them back to the ED if: A.  The seizure lasts longer than 5 minutes.      B.  The patient doesn't wake shortly after the seizure or has new problems such as difficulty seeing, speaking or moving following the seizure C.  The patient was injured during the seizure D.  The patient has a temperature over 102 F (39C) E.  The patient vomited during the seizure and now is having trouble breathing    During the Seizure   - First, ensure adequate ventilation and place patients on the floor on their left side  Loosen clothing around the neck and ensure the airway is patent. If the patient is clenching the teeth, do not force the mouth open with any object as this can cause severe damage - Remove all items from the surrounding that can be hazardous. The patient may  be oblivious to what's happening and may not even know what he or she is doing. If the patient is confused and wandering, either gently guide him/her away and block access to outside areas - Reassure the individual and be comforting - Call 911. In most cases, the seizure ends before EMS arrives. However, there are cases when seizures may last over 3 to 5 minutes. Or the individual may have developed breathing difficulties or severe injuries. If a pregnant patient or a person with diabetes develops a seizure, it is prudent to call an ambulance. - Finally, if the patient does not regain full consciousness, then call EMS. Most patients will remain confused for about 45 to 90 minutes after a seizure, so you must use judgment in calling for help. - Avoid restraints but make sure the patient is in a bed with padded side rails - Place the individual in a lateral position with the neck slightly flexed; this will help the saliva drain from the mouth and prevent the tongue from falling backward - Remove all nearby furniture and other hazards from the area - Provide verbal assurance as the individual is regaining consciousness - Provide the patient with privacy if possible - Call for help and start treatment as ordered by the caregiver    After the Seizure (Postictal Stage)   After a seizure, most patients experience confusion, fatigue, muscle pain and/or a headache. Thus, one  should permit the individual to sleep. For the next few days, reassurance is essential. Being calm and helping reorient the person is also of importance.   Most seizures are painless and end spontaneously. Seizures are not harmful to others but can lead to complications such as stress on the lungs, brain and the heart. Individuals with prior lung problems may develop labored breathing and respiratory distress.

## 2021-12-01 NOTE — Progress Notes (Signed)
EEG complete - results pending 

## 2021-12-01 NOTE — Progress Notes (Signed)
Held morning PO meds due to drowsiness. MD notified.

## 2021-12-01 NOTE — Progress Notes (Signed)
Neurology update: MRI and EEG neg. No further neurology workup needed. See original consult note.

## 2021-12-01 NOTE — Evaluation (Signed)
Occupational Therapy Evaluation Patient Details Name: In Shidler MRN: [id number] DOB: 1981-07-29 Today's Date: 12/01/2021   History of Present Illness Jamie Mclaughlin is a 41 y.o. female who presented after friends found her confused in the bathroom, admitted for seizure evaluation on 11/30/21. Pt had two more seizure-like episodes while in the ED. Work up pending. PMHx: hypertension, asthma, obesity, suspected OSA   Clinical Impression   Jamie Mclaughlin was evaluated s/p the above admission list, she is generally indep at baseline including driving and working. She lives in a 2 level home with her 2 friends who will be able to assist at dc. Upon evaluation pt was mod I for bed mobility, set up for sitting ADLs and close min guard - min A for OOB ADLs and functional mobility without AD. She is is limited by an unsteady gait with a few LOB, requiring up to min A to correct. She will benefit from OT acutely to progress limitations listed below. Anticipate pt to progress well acutely, recommend d/c home without follow up OT.      Recommendations for follow up therapy are one component of a multi-disciplinary discharge planning process, led by the attending physician.  Recommendations may be updated based on patient status, additional functional criteria and insurance authorization.   Follow Up Recommendations  No OT follow up    Assistance Recommended at Discharge Intermittent Supervision/Assistance  Patient can return home with the following A little help with walking and/or transfers;A little help with bathing/dressing/bathroom;Assist for transportation;Help with stairs or ramp for entrance    Functional Status Assessment  Patient has had a recent decline in their functional status and demonstrates the ability to make significant improvements in function in a reasonable and predictable amount of time.  Equipment Recommendations  None recommended by OT    Recommendations for Other  Services       Precautions / Restrictions Precautions Precautions: Fall Precaution Comments: seizure Restrictions Weight Bearing Restrictions: No      Mobility Bed Mobility Overal bed mobility: Modified Independent                  Transfers Overall transfer level: Needs assistance Equipment used: None Transfers: Sit to/from Stand Sit to Stand: Min guard                  Balance Overall balance assessment: Needs assistance Sitting-balance support: Feet supported Sitting balance-Leahy Scale: Good                                     ADL either performed or assessed with clinical judgement   ADL Overall ADL's : Needs assistance/impaired Eating/Feeding: Independent;Sitting   Grooming: Min guard;Standing   Upper Body Bathing: Set up;Sitting   Lower Body Bathing: Min guard;Sit to/from stand   Upper Body Dressing : Set up;Sitting   Lower Body Dressing: Min guard;Sit to/from stand Lower Body Dressing Details (indicate cue type and reason): donned socks EOB, min guard for standing Toilet Transfer: Ambulation;Regular Toilet;Minimal assistance Toilet Transfer Details (indicate cue type and reason): slight LOB when getting up from the toilet, min A to correct Toileting- Clothing Manipulation and Hygiene: Supervision/safety;Sitting/lateral lean       Functional mobility during ADLs: Minimal assistance General ADL Comments: pt with good sitting balance but needs min A to close min guard for OOB ambulation. post ictal?     Vision Baseline Vision/History: 0 No visual deficits Ability  to See in Adequate Light: 0 Adequate Patient Visual Report: No change from baseline Vision Assessment?: No apparent visual deficits     Perception     Praxis      Pertinent Vitals/Pain Pain Assessment Pain Assessment: Faces Faces Pain Scale: Hurts a little bit Pain Location: L hip Pain Descriptors / Indicators: Discomfort Pain Intervention(s): Monitored  during session     Hand Dominance Right   Extremity/Trunk Assessment Upper Extremity Assessment Upper Extremity Assessment: Defer to OT evaluation   Lower Extremity Assessment Lower Extremity Assessment: Overall WFL for tasks assessed (ROM WFL; MMT 5/5; coordination WNL; some difficulty with hip flexion in sitting but likely due to body habitus)   Cervical / Trunk Assessment Cervical / Trunk Assessment: Other exceptions Cervical / Trunk Exceptions: increased body habitus   Communication Communication Communication: No difficulties   Cognition Arousal/Alertness: Awake/alert Behavior During Therapy: WFL for tasks assessed/performed, Flat affect Overall Cognitive Status: Within Functional Limits for tasks assessed                                 General Comments: O&Ax4. Follows all commands well. Limited insight to deficits. Seeminly anxious within reason for new medical complication. Pt reports that she moved to [place] due to her fathers death and some family drama that occured proceeding his passing.     General Comments  VSS on RA    Exercises     Shoulder Instructions      Home Living Family/patient expects to be discharged to:: Private residence Living Arrangements: Non-relatives/Friends Available Help at Discharge: Friend(s);Available PRN/intermittently Type of Home: House Home Access: Stairs to enter [employer] of Steps: 3 Entrance Stairs-Rails: None Home Layout: Two level;Able to live on main level with bedroom/bathroom Alternate Level Stairs-Number of Steps: flight   Bathroom Shower/Tub: Tub/shower unit         Home Equipment: None   Additional Comments: lives with 2 friends, there is "normally" someone at home      Prior Functioning/Environment Prior Level of Function : Independent/Modified Independent;Working/employed;Driving             Mobility Comments: no AD 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, drives        OT  Problem List: Decreased range of motion;Decreased activity tolerance;Impaired balance (sitting and/or standing);Decreased coordination;Decreased cognition;Decreased safety awareness      OT Treatment/Interventions:      OT Goals(Current goals can be found in the care plan section) Acute Rehab OT Goals Patient Stated Goal: get better OT Goal Formulation: With patient Time For Goal Achievement: 12/15/21 Potential to Achieve Goals: Good ADL Goals Pt Will Perform Grooming: Independently;standing Pt Will Perform Lower Body Bathing: Independently;sit to/from stand Pt Will Perform Lower Body Dressing: Independently;sit to/from stand Pt Will Transfer to Toilet: Independently;ambulating Pt Will Perform Toileting - Clothing Manipulation and hygiene: Independently  OT Frequency: Min 2X/week    Co-evaluation PT/OT/SLP Co-Evaluation/Treatment: Yes Reason for Co-Treatment: For patient/therapist safety (RN reports very lethargic; seizures, no tech time available) PT goals addressed during session: Mobility/safety with mobility OT goals addressed during session: ADL's and self-care      AM-PAC OT "6 Clicks" Daily Activity     Outcome Measure Help from another person eating meals?: None Help from another person taking care of personal grooming?: A Little Help from another person toileting, which includes using toliet, bedpan, or urinal?: A Little Help from another person bathing (including washing, rinsing, drying)?: A  Little Help from another person to put on and taking off regular upper body clothing?: None Help from another person to put on and taking off regular lower body clothing?: A Little 6 Click Score: 20   End of Session Equipment Utilized During Treatment: Gait belt Nurse Communication: Mobility status  Activity Tolerance: Patient tolerated treatment well Patient left: in chair;with call bell/phone within reach;with chair alarm set  OT Visit Diagnosis: Unsteadiness on feet  (R26.81);Other abnormalities of gait and mobility (R26.89);Muscle weakness (generalized) (M62.81)                Time: [id number] OT Time Calculation (min): 29 min Charges:  OT General Charges $OT Visit: 1 Visit OT Evaluation $OT Eval Moderate Complexity: 1 Mod   Jamie Mclaughlin 12/01/2021, 12:14 PM

## 2021-12-01 NOTE — Procedures (Signed)
History: 41 yo F being evaluated for new onset seizure.   Sedation: None  Technique: This EEG was acquired with electrodes placed according to the International 10-20 electrode system (including Fp1, Fp2, F3, F4, C3, C4, P3, P4, O1, O2, T3, T4, T5, T6, A1, A2, Fz, Cz, Pz). The following electrodes were missing or displaced: none.   Background: The background consists of intermixed alpha and beta activities. There is a well defined posterior dominant rhythm of 8-9 Hz that attenuates with eye opening. Sleep is recorded with normal appearing structures.   Photic stimulation: Physiologic driving is not performed  EEG Abnormalities: None  Clinical Interpretation: This normal EEG is recorded in the waking and sleep state. There was no seizure or seizure predisposition recorded on this study. Please note that lack of epileptiform activity on EEG does not preclude the possibility of epilepsy.   Ritta Slot, MD [facility] [phone]  If 7pm- 7am, please page neurology on call as listed in AMION.

## 2021-12-01 NOTE — Discharge Summary (Signed)
Name: Jamie Mclaughlin MRN: [id number] DOB: [birth year] 41 y.o. PCP: Pcp, No  Date of Admission: 11/30/2021 11:02 AM Date of Discharge: 12/01/2021 Attending Physician: Dr. Jimmye Norman  Discharge Diagnosis: Principal Problem:   New onset seizure [facility])    Discharge Medications: Allergies as of 12/01/2021   Not on File      Medication List     STOP taking these medications    carvedilol 6.25 MG tablet Commonly known as: COREG   NIFEdipine 30 MG 24 hr tablet Commonly known as: PROCARDIA-XL/NIFEDICAL-XL       TAKE these medications    amLODipine 5 MG tablet Commonly known as: NORVASC Take 1 tablet (5 mg total) by mouth daily.   ibuprofen 200 MG tablet Commonly known as: ADVIL Take 200 mg by mouth every 6 (six) hours as needed for fever, headache, mild pain or moderate pain.   lisinopril 40 MG tablet Commonly known as: ZESTRIL Take 1 tablet (40 mg total) by mouth daily.        Disposition and follow-up:   Jamie Mclaughlin was discharged from [facility] in Good condition.  At the hospital follow up visit please address:  1.  Follow-up:  a. Seizures: See if she has had any since discharge. Not discharged on any antiepileptics per neurology    b. OSA: will need outpatient sleep study    c. Hypertension: Intermittently on nifedipine, carvedilol, and lisinopril in outpatient setting. Recently moved here from [place]. Discharged patient on home lisinopril, stopped her coreg and nifedipine. Started amlodipine  D. Urinary incontinence  E. Multiple miscarriages     2.  Labs / imaging needed at time of follow-up: None   3.  Pending labs/ test needing follow-up: None   4.  Medication Changes  Started: amlodipine  Stopped:coreg, nifedipine  Changed:    Follow-up Appointments:  Follow-up Information     Department of Social Services Follow up.   Why: Please contact them to have your medicaid changed to [place]  information: [phone]                Hospital Course by problem list:   Ms. Jamie Mclaughlin is a 41 year old African-American female with past medical history of hypertension, asthma, obesity, suspected OSA, who presented to [facility] ED after being found down and confused at home, had witnessed seizure-like activity in the ED, now back to baseline mental status, and was admitted for further seizure work-up.[1]   #First time seizures #Witnessed seizure-like episodes #AMS, resolved Patient found down, had episode of urinary incontinence, was confused and drowsy suspicious for postictal period following event.  In the ED, initially afebrile and hemodynamically stable, then had 2-minute event where she had a blank stare and started smacking her lips then returned to Aox4.  Had reported witnessed seizure in the CT machine with noted clonic tonic type movements, tachycardia 130s, possible tongue biting, which improved after Versed was given. No fevers, nuchal rigidity.  CT head without bleeding or masses. No history of seizures, patient's brother may have had seizures in childhood with no further events in adulthood.  UDS positive for marijuana otherwise negative.  Reports only occasional alcohol use.  Remote history of cocaine use, long 5th digit nail.  No metabolic derangements, CBC unremarkable, troponins flat, pregnancy test negative.  UA with myoglobin though no evidence of AKI.  Mental status not back to baseline.  ED provider consulted neurology who recommended MRI brain with MR venogram of head,  rEEG which showed no structural abnormalities and no evidence of epileptic activity. She did however have an IV keppra load. Because her MRI brain and MRV head were negative for mass or other structural abnormalities and her EEG showed no epileptic activity neurology decided to hold off on starting the patient on AEDs. She will follow up with neurology in their clinic. [1]  #HTN Patient reports  history of hypertension previously on carvedilol, lisinopril, nifedipine though reports nonadherence since leaving [place].  Previously followed with a cardiologist for blood pressure management.  Normotensive in the ED and throughout her admission. Her coreg and nifedipine were discontinued and patient was started on amlodipine 5mg . She was scheduled for an appointment with [facility] [1]  #Suspected sleep apnea Patient reports history of poor sleep, orthopnea, apneic periods, severe snoring, morning headaches, also has obesity all of which is concerning for sleep apnea.  Patient has previously discussed undergoing a sleep study with former PCP though this was never completed.  STOP-BANG 6. An outpatient sleep study referral was sent on discharge. Patient will also establish in our clinic. [1]  #Polyuria, polydipsia #Urge incontinence Family history diabetes.  Patient reports has never been tested for diabetes.  A1c was 5.6 this hospitalization. Reports having mixed incontinence (urge/stress) over the last several months, her UA was not concerning for infection and patient did not have diabetes. She will follow up in our clinic for further work up of this. [1]  Discharge Subjective: Since admission, patient's mental status continued to improve. She states that she gets really sleepy during the day but has never had a sleep study. She also stated that she sometimes has awoken confused and noticed that she has bitten her tongue, but was never evaluated for seizures. She has not had any seizures since day of admission and feels she is back at her baseline level of mentation.   Discharge Exam:   BP (!) 134/97 (BP Location: Left Arm)    Pulse 90    Temp 98 F (36.7 C) (Oral)    Resp 19    LMP  (LMP Unknown) Comment: irregular   SpO2 99%    Constitutional: well-appearing sitting in chair, in no acute distress HENT: normocephalic atraumatic, mucous membranes moist Eyes: conjunctiva non-erythematous Neck:  supple Cardiovascular: regular rate and rhythm, no m/r/g Pulmonary/Chest: normal work of breathing on room air, lungs clear to auscultation bilaterally Abdominal: soft, non-tender, non-distended MSK: normal bulk and tone Neurological: alert & oriented x 3, 5/5 strength in bilateral upper and lower extremities, normal gait Skin: warm and dry Psych: broad affect   Pertinent Labs, Studies, and Procedures:  CBC Latest Ref Rng & Units 12/01/2021 11/30/2021  WBC 4.0 - 10.5 K/uL 10.6(H) 10.1  Hemoglobin 12.0 - 15.0 g/dL 13.0 14.1  Hematocrit 36.0 - 46.0 % 38.9 44.2  Platelets 150 - 400 K/uL 268 292    CMP Latest Ref Rng & Units 12/01/2021 11/30/2021  Glucose 70 - 99 mg/dL 94 117(H)  BUN 6 - 20 mg/dL 7 12  Creatinine 0.44 - 1.00 mg/dL 0.77 0.73  Sodium 135 - 145 mmol/L 138 136  Potassium 3.5 - 5.1 mmol/L 3.0(L) 4.3  Chloride 98 - 111 mmol/L 103 106  CO2 22 - 32 mmol/L 25 20(L)  Calcium 8.9 - 10.3 mg/dL 8.8(L) 8.8(L)  Total Protein 6.5 - 8.1 g/dL - 8.1  Total Bilirubin 0.3 - 1.2 mg/dL - 0.3  Alkaline Phos 38 - 126 U/L - 99  AST 15 - 41 U/L - 24  ALT 0 - 44 U/L - 16    DG Chest 2 View  Result Date: 11/30/2021 CLINICAL DATA:  Found unresponsive on bathroom floor, confusion EXAM: CHEST - 2 VIEW COMPARISON:  11/30/2021 at 1:39 p.m. FINDINGS: Frontal and lateral views of the chest demonstrate a stable enlarged cardiac silhouette. No acute airspace disease, effusion, or pneumothorax. No acute bony abnormality. IMPRESSION: 1. Stable chest, no acute process. Electronically Signed   By: Randa Ngo M.D.   On: 11/30/2021 17:36   CT Head Wo Contrast  Result Date: 11/30/2021 CLINICAL DATA:  Syncope.  Altered mental status.  Seizures. EXAM: CT HEAD WITHOUT CONTRAST TECHNIQUE: Contiguous axial images were obtained from the base of the skull through the vertex without intravenous contrast. RADIATION DOSE REDUCTION: This exam was performed according to the departmental dose-optimization program which  includes automated exposure control, adjustment of the mA and/or kV according to patient size and/or use of iterative reconstruction technique. COMPARISON:  None. FINDINGS: Brain: Minimal motion degradation inferiorly. No mass lesion, hemorrhage, hydrocephalus, acute infarct, intra-axial, or extra-axial fluid collection. Vascular: No hyperdense vessel or unexpected calcification. Skull: No significant soft tissue swelling.  No skull fracture. Sinuses/Orbits: Normal imaged portions of the orbits and globes. Mucosal thickening ethmoid air cells. Clear mastoid air cells. Hypoplastic right frontal sinus. Other: None. IMPRESSION: 1. Mild motion degradation inferiorly. Given this limitation, no acute intracranial abnormality. 2. Sinus disease. Electronically Signed   By: Abigail Miyamoto M.D.   On: 11/30/2021 16:14   MR BRAIN WO CONTRAST  Result Date: 12/01/2021 CLINICAL DATA:  Seizure EXAM: MRI HEAD WITHOUT CONTRAST MRV HEAD WITHOUT CONTRAST TECHNIQUE: Multiplanar, multi-echo pulse sequences of the brain and surrounding structures were acquired without intravenous contrast. Angiographic images of the intracranial venous structures were acquired using MRV technique without intravenous contrast. COMPARISON:  No pertinent prior exam. FINDINGS: MRI HEAD WITHOUT CONTRAST Brain: No acute infarct, mass effect or extra-axial collection. No acute or chronic hemorrhage. Normal white matter signal, parenchymal volume and CSF spaces. The midline structures are normal. Vascular: Major flow voids are preserved. Skull and upper cervical spine: Normal calvarium and skull base. Visualized upper cervical spine and soft tissues are normal. Sinuses/Orbits:No paranasal sinus fluid levels or advanced mucosal thickening. No mastoid or middle ear effusion. Normal orbits. MR VENOGRAM WITHOUT CONTRAST Superior sagittal sinus: Normal. Straight sinus: Normal. Inferior sagittal sinus, vein of Galen and internal cerebral veins: Normal. Transverse  sinuses: Limited flow related enhancement of the left transverse sinus, likely a normal variant. Right transverse sinus is normal. Sigmoid sinuses: Normal. Visualized jugular veins: Normal. IMPRESSION: Normal MRI and MRV of the brain. Electronically Signed   By: Ulyses Jarred M.D.   On: 12/01/2021 00:42   DG Chest Port 1 View  Result Date: 11/30/2021 CLINICAL DATA:  Headache, found unresponsive on bathroom floor EXAM: PORTABLE CHEST 1 VIEW COMPARISON:  None. FINDINGS: Top-normal heart size. Normal mediastinal contour. No pneumothorax. No pleural effusion. Lungs appear clear, with no acute consolidative airspace disease and no pulmonary edema. IMPRESSION: No active disease. Electronically Signed   By: Ilona Sorrel M.D.   On: 11/30/2021 13:51   EEG adult  Result Date: 12/01/2021 Greta Doom, MD     12/01/2021 10:33 AM History: 41 yo F being evaluated for new onset seizure. Sedation: None Technique: This EEG was acquired with electrodes placed according to the International 10-20 electrode system (including Fp1, Fp2, F3, F4, C3, C4, P3, P4, O1, O2, T3, T4, T5, T6, A1, A2, Fz, Cz, Pz). The following  electrodes were missing or displaced: none. Background: The background consists of intermixed alpha and beta activities. There is a well defined posterior dominant rhythm of 8-9 Hz that attenuates with eye opening. Sleep is recorded with normal appearing structures. Photic stimulation: Physiologic driving is not performed EEG Abnormalities: None Clinical Interpretation: This normal EEG is recorded in the waking and sleep state. There was no seizure or seizure predisposition recorded on this study. Please note that lack of epileptiform activity on EEG does not preclude the possibility of epilepsy. Roland Rack, MD [facility] [phone] If 7pm- 7am, please page neurology on call as listed in [place].   MR MRV HEAD WO CM  Result Date: 12/01/2021 CLINICAL DATA:  Seizure EXAM: MRI HEAD  WITHOUT CONTRAST MRV HEAD WITHOUT CONTRAST TECHNIQUE: Multiplanar, multi-echo pulse sequences of the brain and surrounding structures were acquired without intravenous contrast. Angiographic images of the intracranial venous structures were acquired using MRV technique without intravenous contrast. COMPARISON:  No pertinent prior exam. FINDINGS: MRI HEAD WITHOUT CONTRAST Brain: No acute infarct, mass effect or extra-axial collection. No acute or chronic hemorrhage. Normal white matter signal, parenchymal volume and CSF spaces. The midline structures are normal. Vascular: Major flow voids are preserved. Skull and upper cervical spine: Normal calvarium and skull base. Visualized upper cervical spine and soft tissues are normal. Sinuses/Orbits:No paranasal sinus fluid levels or advanced mucosal thickening. No mastoid or middle ear effusion. Normal orbits. MR VENOGRAM WITHOUT CONTRAST Superior sagittal sinus: Normal. Straight sinus: Normal. Inferior sagittal sinus, vein of Galen and internal cerebral veins: Normal. Transverse sinuses: Limited flow related enhancement of the left transverse sinus, likely a normal variant. Right transverse sinus is normal. Sigmoid sinuses: Normal. Visualized jugular veins: Normal. IMPRESSION: Normal MRI and MRV of the brain. Electronically Signed   By: Ulyses Jarred M.D.   On: 12/01/2021 00:42   DG HIP UNILAT WITH PELVIS 2-3 VIEWS LEFT  Result Date: 11/30/2021 CLINICAL DATA:  Acute left hip pain EXAM: DG HIP (WITH OR WITHOUT PELVIS) 2-3V LEFT COMPARISON:  None. FINDINGS: Frontal view of the pelvis as well as frontal and frogleg lateral views of the left hip are obtained. No acute fracture, subluxation, or dislocation. Joint spaces are well preserved. Soft tissues are unremarkable. IMPRESSION: 1. No acute bony abnormality. Electronically Signed   By: Randa Ngo M.D.   On: 11/30/2021 21:59     Discharge Instructions: Discharge Instructions     Call MD for:  difficulty  breathing, headache or visual disturbances   Complete by: As directed    Call MD for:  persistant dizziness or light-headedness   Complete by: As directed    Call MD for:  persistant nausea and vomiting   Complete by: As directed    Call MD for:  severe uncontrolled pain   Complete by: As directed    Call MD for:  temperature >100.4   Complete by: As directed    Home sleep test   Complete by: As directed    Where should this test be performed: Other       Signed: Rick Duff, MD 12/01/2021, 4:17 PM   Pager: [phone]  [1] Admission H&P, Wayland Denis MD

## 2021-12-01 NOTE — Plan of Care (Signed)

## 2021-12-02 ENCOUNTER — Telehealth: Payer: Self-pay | Admitting: Student

## 2021-12-02 NOTE — Telephone Encounter (Signed)
TOC NEW HFU APPT  Name: Jamie Mclaughlin, Jamie Mclaughlin MRN: [id number]  Date: 12/15/2021 Status: Sch  Time: 2:45 PM Length: 60  Visit Type: OPEN ESTABLISHED [726] Copay: $0.00  Provider: Scarlett Presto, MD

## 2021-12-15 ENCOUNTER — Encounter: Payer: Medicaid - Out of State | Admitting: Internal Medicine

## 2022-01-01 ENCOUNTER — Other Ambulatory Visit: Payer: Self-pay

## 2022-01-01 ENCOUNTER — Encounter (HOSPITAL_COMMUNITY): Payer: Self-pay

## 2022-01-01 ENCOUNTER — Emergency Department (HOSPITAL_COMMUNITY): Payer: Medicaid - Out of State

## 2022-01-01 ENCOUNTER — Emergency Department (HOSPITAL_COMMUNITY)
Admission: EM | Admit: 2022-01-01 | Discharge: 2022-01-01 | Disposition: A | Discharge status: Home / Self Care | Payer: Medicaid - Out of State | Attending: Emergency Medicine | Admitting: Emergency Medicine

## 2022-01-01 DIAGNOSIS — E875 Hyperkalemia: Secondary | ICD-10-CM | POA: Diagnosis not present

## 2022-01-01 DIAGNOSIS — R569 Unspecified convulsions: Secondary | ICD-10-CM | POA: Diagnosis present

## 2022-01-01 DIAGNOSIS — Z79899 Other long term (current) drug therapy: Secondary | ICD-10-CM | POA: Insufficient documentation

## 2022-01-01 DIAGNOSIS — R519 Headache, unspecified: Secondary | ICD-10-CM | POA: Diagnosis not present

## 2022-01-01 DIAGNOSIS — I10 Essential (primary) hypertension: Secondary | ICD-10-CM | POA: Insufficient documentation

## 2022-01-01 LAB — URINALYSIS, ROUTINE W REFLEX MICROSCOPIC
Bilirubin Urine: NEGATIVE
Glucose, UA: NEGATIVE mg/dL
Ketones, ur: 5 mg/dL — AB
Leukocytes,Ua: NEGATIVE
Nitrite: NEGATIVE
Protein, ur: 100 mg/dL — AB
Specific Gravity, Urine: 1.013 (ref 1.005–1.030)
pH: 6 (ref 5.0–8.0)

## 2022-01-01 LAB — CBC WITH DIFFERENTIAL/PLATELET
Abs Immature Granulocytes: 0.02 10*3/uL (ref 0.00–0.07)
Basophils Absolute: 0 10*3/uL (ref 0.0–0.1)
Basophils Relative: 0 %
Eosinophils Absolute: 0 10*3/uL (ref 0.0–0.5)
Eosinophils Relative: 0 %
HCT: 43.3 % (ref 36.0–46.0)
Hemoglobin: 14.1 g/dL (ref 12.0–15.0)
Immature Granulocytes: 0 %
Lymphocytes Relative: 17 %
Lymphs Abs: 1.4 10*3/uL (ref 0.7–4.0)
MCH: 27.6 pg (ref 26.0–34.0)
MCHC: 32.6 g/dL (ref 30.0–36.0)
MCV: 84.9 fL (ref 80.0–100.0)
Monocytes Absolute: 0.4 10*3/uL (ref 0.1–1.0)
Monocytes Relative: 5 %
Neutro Abs: 6.5 10*3/uL (ref 1.7–7.7)
Neutrophils Relative %: 78 %
Platelets: 253 10*3/uL (ref 150–400)
RBC: 5.1 MIL/uL (ref 3.87–5.11)
RDW: 16.6 % — ABNORMAL HIGH (ref 11.5–15.5)
WBC: 8.4 10*3/uL (ref 4.0–10.5)
nRBC: 0 % (ref 0.0–0.2)

## 2022-01-01 LAB — COMPREHENSIVE METABOLIC PANEL
ALT: 12 U/L (ref 0–44)
AST: 33 U/L (ref 15–41)
Albumin: 3.2 g/dL — ABNORMAL LOW (ref 3.5–5.0)
Alkaline Phosphatase: 70 U/L (ref 38–126)
Anion gap: 10 (ref 5–15)
BUN: 12 mg/dL (ref 6–20)
CO2: 20 mmol/L — ABNORMAL LOW (ref 22–32)
Calcium: 7.9 mg/dL — ABNORMAL LOW (ref 8.9–10.3)
Chloride: 105 mmol/L (ref 98–111)
Creatinine, Ser: 0.95 mg/dL (ref 0.44–1.00)
GFR, Estimated: >60 mL/min (ref >60)
Glucose, Bld: 96 mg/dL (ref 70–99)
Potassium: 5.2 mmol/L — ABNORMAL HIGH (ref 3.5–5.1)
Sodium: 135 mmol/L (ref 135–145)
Total Bilirubin: 0.8 mg/dL (ref 0.3–1.2)
Total Protein: 7.1 g/dL (ref 6.5–8.1)

## 2022-01-01 LAB — I-STAT BETA HCG BLOOD, ED (MC, WL, AP ONLY): I-stat hCG, quantitative: <5 m[IU]/mL (ref <5)

## 2022-01-01 LAB — ETHANOL: Alcohol, Ethyl (B): <10 mg/dL (ref <10)

## 2022-01-01 IMAGING — CR DG RIBS W/ CHEST 3+V*R*
4 series · 4 of 4 positions shown · non-contrast
Comparison: [DATE]

CLINICAL DATA: Status post seizure.

EXAM:
RIGHT RIBS AND CHEST - 3+ VIEW

[x chest ap (1 of 4)]
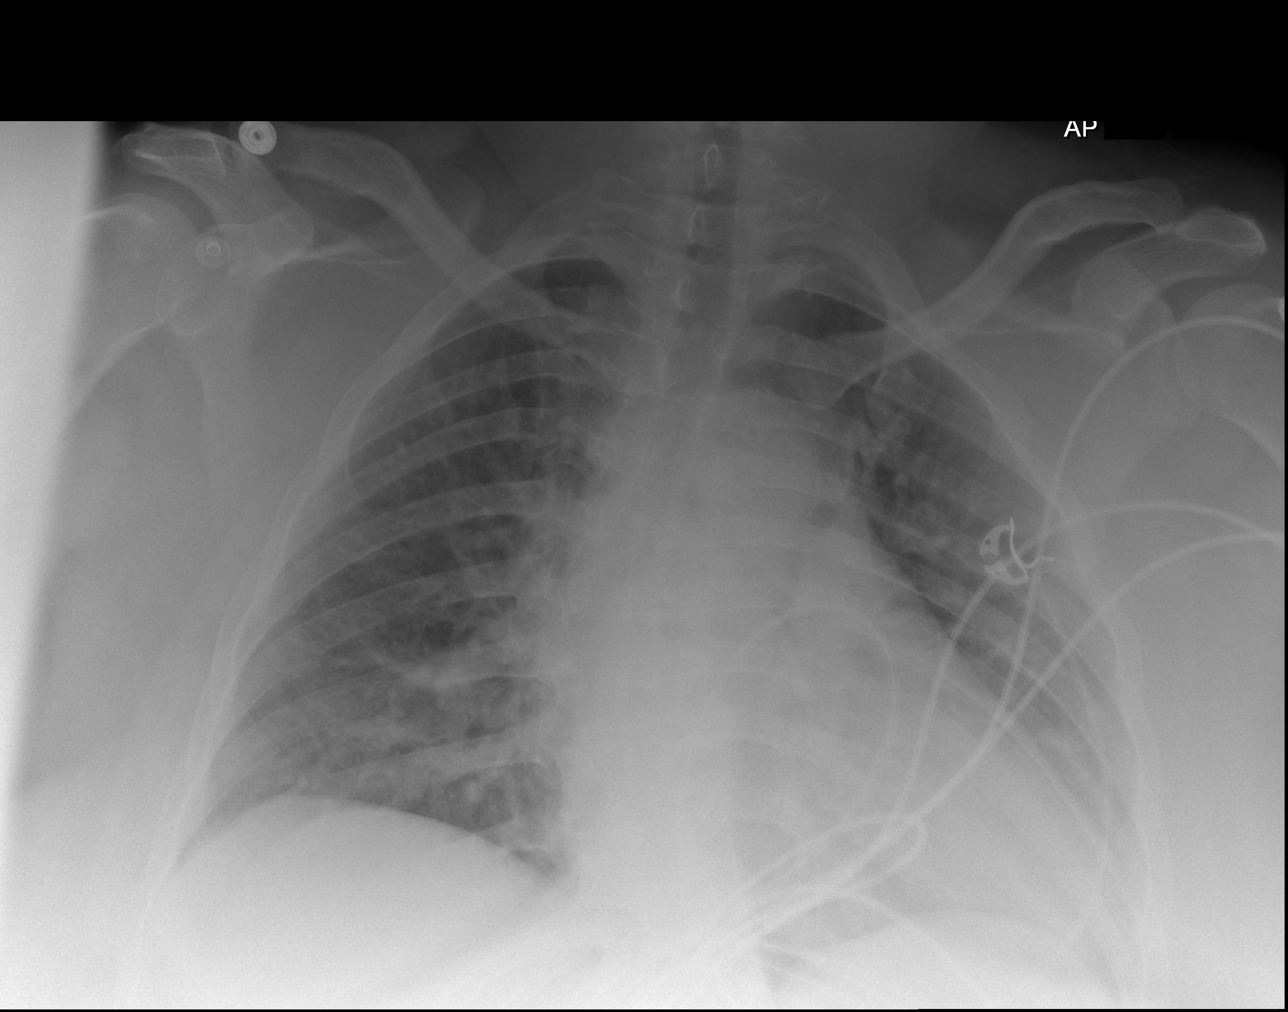

[x chest ap (2 of 4)]
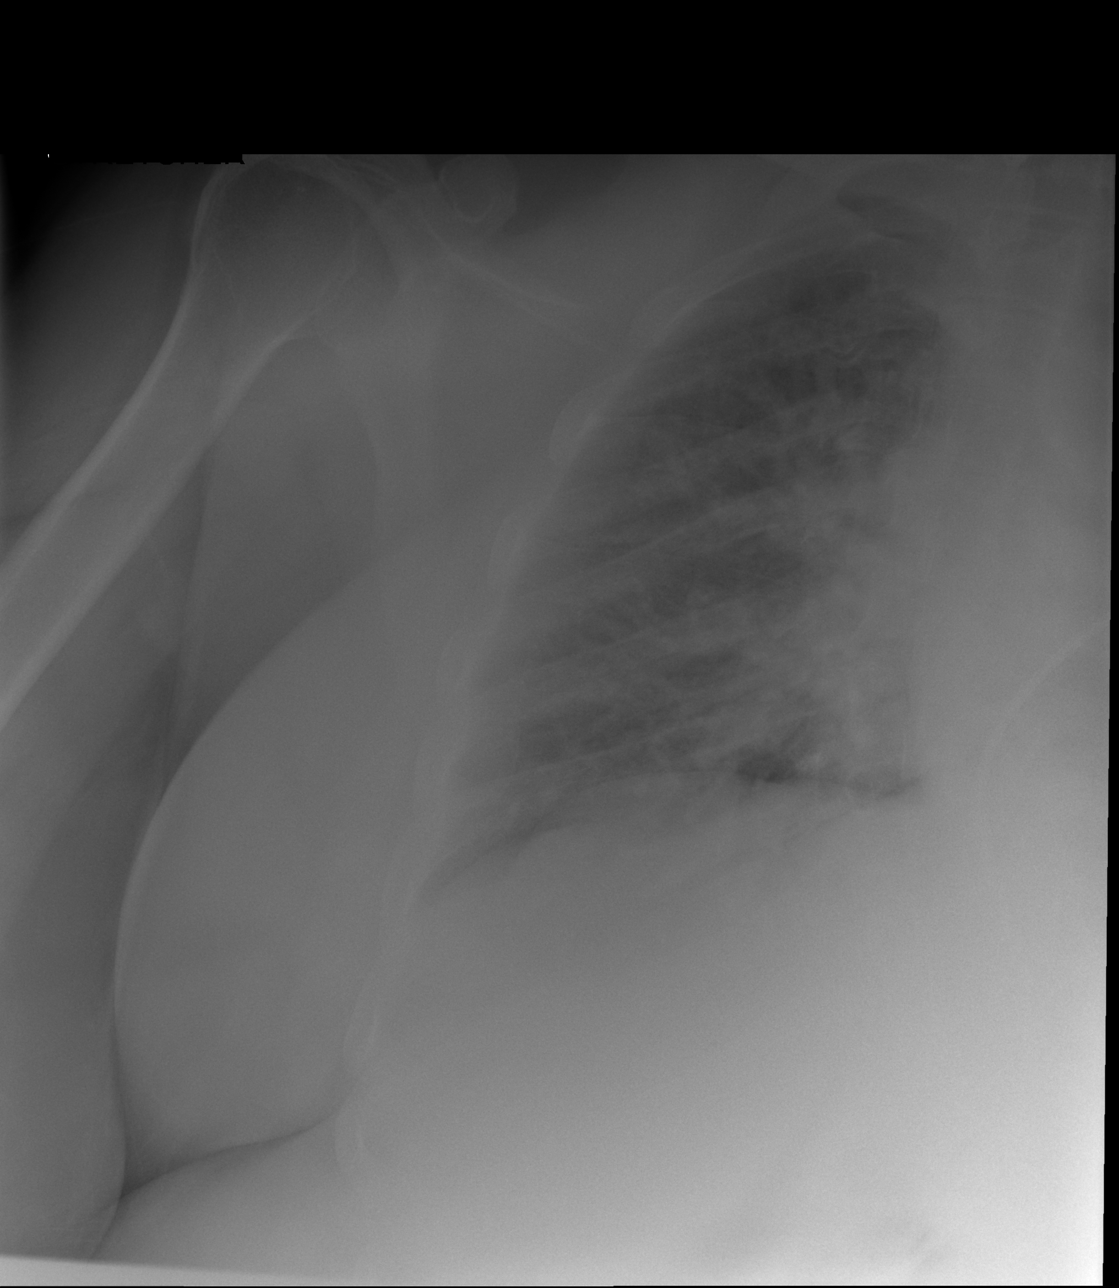

[x chest ap (3 of 4)]
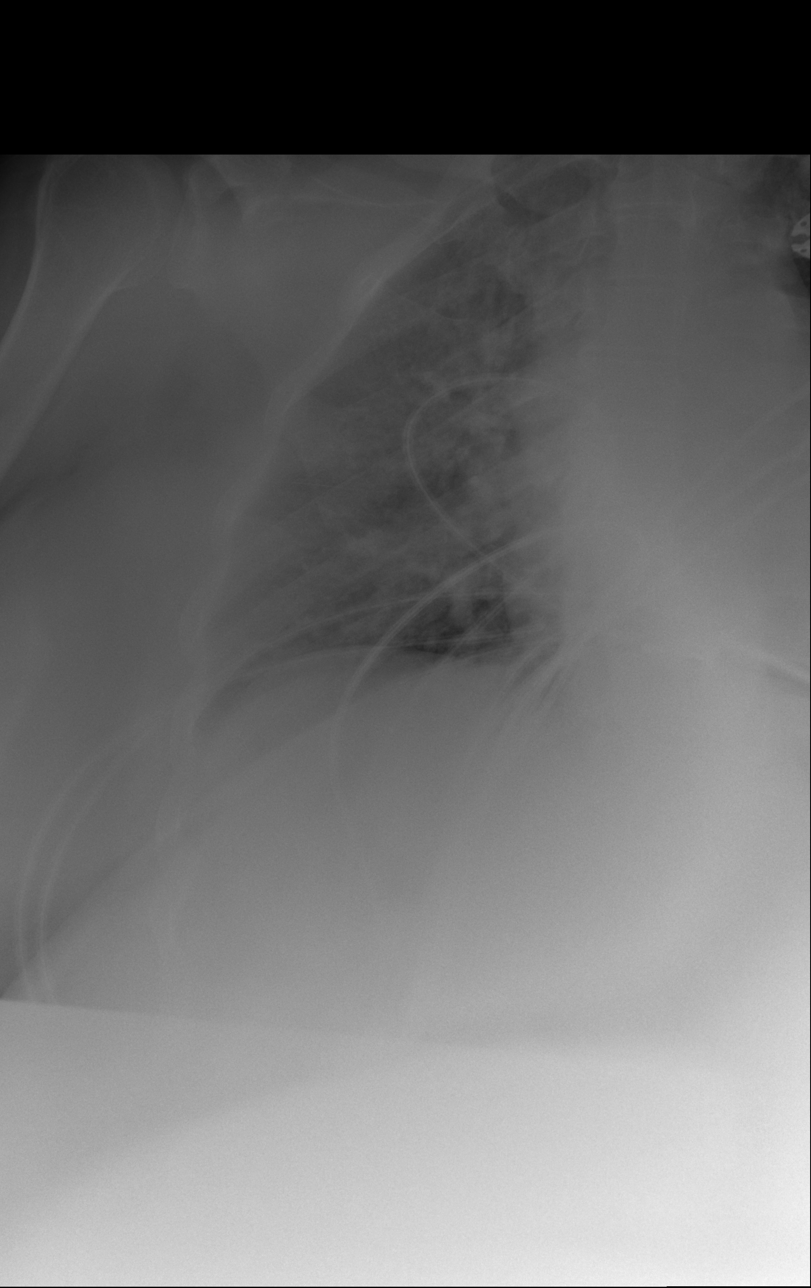

[x chest ap (4 of 4)]
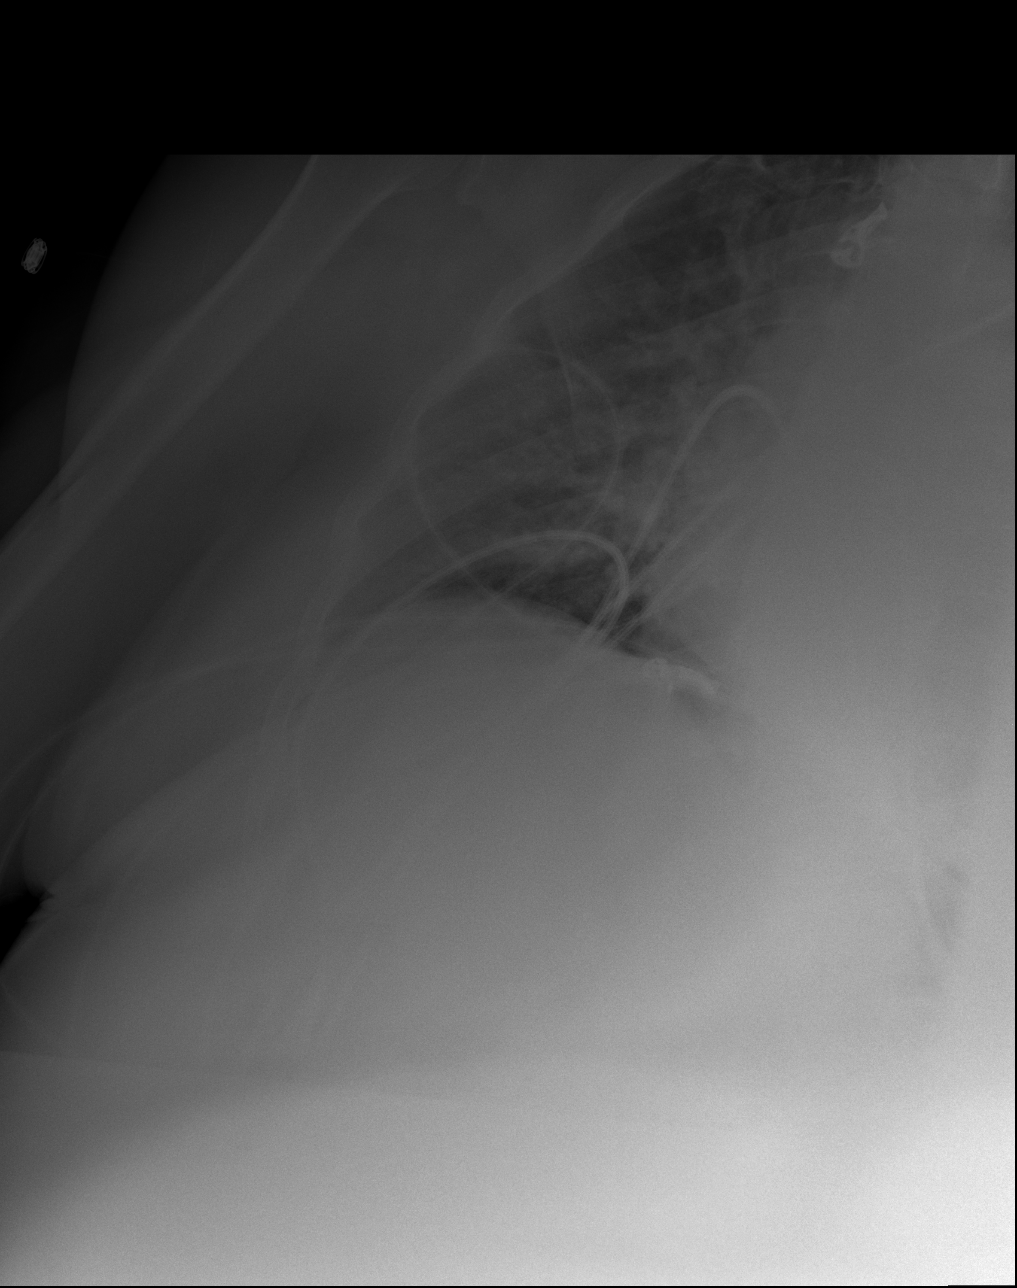

[4 of 4 positions shown; findings below may reference images not displayed]

FINDINGS: No fracture or other bone lesions are seen involving the ribs. There
is no evidence of pneumothorax or pleural effusion. There is
prominence of the suprahilar and infrahilar pulmonary vasculature.
Heart size and mediastinal contours are within normal limits.
IMPRESSION: 1. No rib fracture or other bone lesions.
2. Findings which may represent mild pulmonary vascular congestion.

## 2022-01-01 MED ORDER — LEVETIRACETAM IN NACL 500 MG/100ML IV SOLN
500.0000 mg | Freq: Two times a day (BID) | INTRAVENOUS | Status: DC
  Administered 2022-01-01: 500 mg via INTRAVENOUS
  Filled 2022-01-01: qty 100

## 2022-01-01 MED ORDER — LEVETIRACETAM 500 MG PO TABS
500.0000 mg | ORAL_TABLET | Freq: Two times a day (BID) | ORAL | 0 refills | Status: AC
  Filled 2022-01-01: qty 60, 30d supply, fill #0

## 2022-01-01 MED ORDER — SODIUM CHLORIDE 0.9 % IV BOLUS
1000.0000 mL | Freq: Once | INTRAVENOUS | Status: AC
  Administered 2022-01-01: 1000 mL via INTRAVENOUS

## 2022-01-01 MED ORDER — PROCHLORPERAZINE EDISYLATE 10 MG/2ML IJ SOLN
10.0000 mg | Freq: Once | INTRAMUSCULAR | Status: AC
  Administered 2022-01-01: 10 mg via INTRAVENOUS
  Filled 2022-01-01: qty 2

## 2022-01-01 MED ORDER — ACETAMINOPHEN 500 MG PO TABS
1000.0000 mg | ORAL_TABLET | Freq: Once | ORAL | Status: AC
  Administered 2022-01-01: 1000 mg via ORAL
  Filled 2022-01-01: qty 2

## 2022-01-01 MED ORDER — KETOROLAC TROMETHAMINE 15 MG/ML IJ SOLN
15.0000 mg | Freq: Once | INTRAMUSCULAR | Status: AC
Start: 2022-01-01 — End: 2022-01-01
  Administered 2022-01-01: 15 mg via INTRAVENOUS
  Filled 2022-01-01: qty 1

## 2022-01-01 NOTE — ED Triage Notes (Addendum)
Pt arrived via EMS, was in vehicle on way to restaurant, started with headache, started with seizure activity per friends approx 3 mins. Postictal on EMS arrival.  Recent dx with seizures x1 month ago.  ?  ?20G L Hand ?

## 2022-01-01 NOTE — ED Notes (Signed)
Pt calling her ride 

## 2022-01-01 NOTE — Discharge Instructions (Signed)
As discussed, you have had a referral to our neurology colleagues.  They will call you for a follow-up visit next week.  With your new seizure activity it is important that you obtain and take your medication as prescribed.  Return here for concerning changes in your condition. ?

## 2022-01-01 NOTE — ED Provider Notes (Signed)
?[facility]-EMERGENCY DEPT ?Provider Note ? ? ?CSN: [id number] ?Arrival date & time: 01/01/22  1708 ? ?  ? ?History ? ?Chief Complaint  ?Patient presents with  ? Seizures  ? ? ?Jamie Mclaughlin is a 41 y.o. female. ? ?HPI ?Patient presents with concern of possible seizure.  She notes she had 1 prior event, earlier this year, is on no antiepileptics.  She has hypertension issues, has been taking her medication regularly. ?She recently moved here from [place]. ?Today she was riding in a car with friends, when she was witnessed to have seizure-like activity.  She was brought here by EMS.  EMS reports patient was postictal in transport, but improved gradually throughout.  Patient denies confusion, does have mild headache, she does have some pain in the right inframammary region, sore ?  ? ?Home Medications ?Prior to Admission medications   ?Medication Sig Start Date End Date Taking? Authorizing Provider  ?amLODipine (NORVASC) 5 MG tablet Take 1 tablet (5 mg total) by mouth daily. 12/01/21 12/01/22  Marolyn Haller, MD  ?ibuprofen (ADVIL) 200 MG tablet Take 200 mg by mouth every 6 (six) hours as needed for fever, headache, mild pain or moderate pain.    [provider]  ?lisinopril (ZESTRIL) 40 MG tablet Take 1 tablet (40 mg total) by mouth daily. 12/01/21   Marolyn Haller, MD  ?   ? ?Allergies    ?Patient has no known allergies.   ? ?Review of Systems   ?Review of Systems  ?Constitutional:   ?     Per HPI, otherwise negative  ?HENT:    ?     Per HPI, otherwise negative  ?Respiratory:    ?     Per HPI, otherwise negative  ?Cardiovascular:   ?     Per HPI, otherwise negative  ?Gastrointestinal:  Negative for vomiting.  ?Endocrine:  ?     Negative aside from HPI  ?Genitourinary:   ?     Neg aside from HPI   ?Musculoskeletal:   ?     Per HPI, otherwise negative  ?Skin: Negative.   ?Neurological:  Positive for seizures. Negative for syncope.  ? ?Physical Exam ?Updated Vital Signs ?BP (!)  155/120   Pulse (!) 105   Temp 99.1 ?F (37.3 ?C) (Oral)   Resp (!) 21   Ht 5\' 7"  (1.702 m)   Wt (!) 142.9 kg   SpO2 100%   BMI 49.34 kg/m?  ?Physical Exam ?Vitals and nursing note reviewed.  ?Constitutional:   ?   General: She is not in acute distress. ?   Appearance: She is well-developed.  ?HENT:  ?   Head: Normocephalic and atraumatic.  ?Eyes:  ?   Conjunctiva/sclera: Conjunctivae normal.  ?Cardiovascular:  ?   Rate and Rhythm: Normal rate and regular rhythm.  ?Pulmonary:  ?   Effort: Pulmonary effort is normal. No respiratory distress.  ?   Breath sounds: Normal breath sounds. No stridor.  ?Abdominal:  ?   General: There is no distension.  ?Skin: ?   General: Skin is warm and dry.  ?Neurological:  ?   Mental Status: She is alert and oriented to person, place, and time.  ?   Cranial Nerves: No cranial nerve deficit.  ?   Motor: No weakness.  ?   Coordination: Coordination normal.  ?Psychiatric:     ?   Mood and Affect: Mood normal.  ? ? ?ED Results / Procedures / Treatments   ?Labs ?(all labs  ordered are listed, but only abnormal results are displayed) ?Labs Reviewed  ?CBC WITH DIFFERENTIAL/PLATELET - Abnormal; Notable for the following components:  ?    Result Value  ? RDW 16.6 (*)   ? All other components within normal limits  ?URINALYSIS, ROUTINE W REFLEX MICROSCOPIC - Abnormal; Notable for the following components:  ? Hgb urine dipstick MODERATE (*)   ? Ketones, ur 5 (*)   ? Protein, ur 100 (*)   ? Bacteria, UA RARE (*)   ? All other components within normal limits  ?COMPREHENSIVE METABOLIC PANEL  ?ETHANOL  ?I-STAT BETA HCG BLOOD, ED ([facility], [facility], [facility] ONLY)  ? ? ?EKG ?None ? ?Radiology ?No results found. ? ?Procedures ?Procedures  ? ? ?Medications Ordered in ED ?Medications  ?sodium chloride 0.9 % bolus 1,000 mL (1,000 mLs Intravenous New Bag/Given 01/01/22 1758)  ? ? ?ED Course/ Medical Decision Making/ A&P ?This patient presents to the ED for concern of seizure, this involves an extensive number of  treatment options, and is a complaint that carries with it a high risk of complications and morbidity.  The differential diagnosis includes encephalopathy, electrolyte abnormalities, new seizure disorder, trauma ? ? ?Co morbidities that complicate the patient evaluation ? ?Obesity, hypertension ? ?Social Determinants of Health: ? ?Obesity ? ?Additional history obtained: ? ?Additional history and/or information obtained from EMS as above ? ? ?After the initial evaluation, orders, including: Continuous monitoring, x-ray, labs were initiated. ? ? ?Patient placed on Cardiac and Pulse-Oximetry Monitors. ?The patient was maintained on a cardiac monitor.  The cardiac monitored showed an rhythm of 105 sinus tach abnormal ?The patient was also maintained on pulse oximetry. The readings were typically 100% room air normal ? ? ?On repeat evaluation of the patient improved ?10:16 PM ?Following fluids, Keppra, Compazine, Toradol, patient is awake and alert, substantially more comfortable ?Lab Tests: ? ?I personally interpreted labs.  The pertinent results include: Mild hyperkalemia ? ? ? ?I independently visualized and interpreted imaging which showed MRI result from last month with no acute intracranial abnormalities ?I agree with the radiologist interpretation ? ? ?Dispostion / Final MDM: ? ?After consideration of the diagnostic results and the patient's response to treatment, patient is appropriate for discharge with outpatient neurology follow-up.  This adult female recently transient to the area, no prior history of seizures now presents for the second episode of seizures within the past 2 months.  Patient has prior imaging which was reassuring, but given the recurrent episode she was started on IV Keppra, discharged with oral.  No evidence for concurrent new phenomena, bacteremia, sepsis.  Patient is neurologically intact, hemodynamically unremarkable on discharge.  Hospitalization was a consideration given the recurrent  seizures, but patient tolerated medications appropriately, as above is appropriate for discharge. ? ?Final Clinical Impression(s) / ED Diagnoses ?Final diagnoses:  ?Seizure (HCC)  ? ? ?Rx / DC Orders ?ED Discharge Orders   ? ?      Ordered  ?  Ambulatory referral to Neurology       ?Comments: An appointment is requested in approximately: 1 week  ? 01/01/22 2214  ?  levETIRAcetam (KEPPRA) 500 MG tablet  2 times daily       ? 01/01/22 2214  ? ?  ?  ? ?  ? ? ?  ?Gerhard Munch, MD ?01/01/22 2217 ? ?

## 2022-01-04 ENCOUNTER — Other Ambulatory Visit (HOSPITAL_COMMUNITY): Payer: Self-pay
